# Patient Record
Sex: Male | Born: 1950 | Race: White | Hispanic: No | Smoking: Former smoker
Health system: Southern US, Community
[De-identification: ages and names within clinical notes are randomized; demographics above are authoritative.]

## PROBLEM LIST (undated history)

## (undated) DIAGNOSIS — H509 Unspecified strabismus: Secondary | ICD-10-CM

## (undated) DIAGNOSIS — E785 Hyperlipidemia, unspecified: Secondary | ICD-10-CM

## (undated) DIAGNOSIS — K4090 Unilateral inguinal hernia, without obstruction or gangrene, not specified as recurrent: Secondary | ICD-10-CM

## (undated) DIAGNOSIS — R011 Cardiac murmur, unspecified: Secondary | ICD-10-CM

## (undated) DIAGNOSIS — H9190 Unspecified hearing loss, unspecified ear: Secondary | ICD-10-CM

## (undated) DIAGNOSIS — Z8719 Personal history of other diseases of the digestive system: Secondary | ICD-10-CM

## (undated) DIAGNOSIS — M549 Dorsalgia, unspecified: Secondary | ICD-10-CM

## (undated) DIAGNOSIS — I10 Essential (primary) hypertension: Secondary | ICD-10-CM

## (undated) DIAGNOSIS — K219 Gastro-esophageal reflux disease without esophagitis: Secondary | ICD-10-CM

## (undated) DIAGNOSIS — E039 Hypothyroidism, unspecified: Secondary | ICD-10-CM

## (undated) DIAGNOSIS — R7303 Prediabetes: Secondary | ICD-10-CM

## (undated) DIAGNOSIS — E079 Disorder of thyroid, unspecified: Secondary | ICD-10-CM

## (undated) DIAGNOSIS — M109 Gout, unspecified: Secondary | ICD-10-CM

## (undated) DIAGNOSIS — G4700 Insomnia, unspecified: Secondary | ICD-10-CM

## (undated) HISTORY — DX: Gastro-esophageal reflux disease without esophagitis: K21.9

## (undated) HISTORY — DX: Disorder of thyroid, unspecified: E07.9

## (undated) HISTORY — DX: Personal history of other diseases of the digestive system: Z87.19

## (undated) HISTORY — DX: Hyperlipidemia, unspecified: E78.5

## (undated) HISTORY — PX: TONSILLECTOMY: SUR1361

## (undated) HISTORY — DX: Cardiac murmur, unspecified: R01.1

## (undated) HISTORY — DX: Insomnia, unspecified: G47.00

## (undated) HISTORY — DX: Dorsalgia, unspecified: M54.9

## (undated) HISTORY — DX: Unspecified hearing loss, unspecified ear: H91.90

## (undated) HISTORY — DX: Essential (primary) hypertension: I10

## (undated) HISTORY — DX: Unspecified strabismus: H50.9

## (undated) HISTORY — DX: Gout, unspecified: M10.9

## (undated) HISTORY — PX: HERNIA REPAIR: SHX51

---
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
## 2005-01-27 ENCOUNTER — Ambulatory Visit (HOSPITAL_COMMUNITY): Admission: RE | Admit: 2005-01-27 | Discharge: 2005-01-27 | Payer: Self-pay | Admitting: Gastroenterology

## 2008-12-29 DEATH — deceased

## 2011-11-29 HISTORY — PX: COLONOSCOPY W/ BIOPSIES AND POLYPECTOMY: SHX1376

## 2013-02-18 ENCOUNTER — Other Ambulatory Visit: Payer: Self-pay | Admitting: Gastroenterology

## 2013-11-11 ENCOUNTER — Encounter (INDEPENDENT_AMBULATORY_CARE_PROVIDER_SITE_OTHER): Payer: Self-pay | Admitting: Surgery

## 2013-11-26 ENCOUNTER — Encounter (INDEPENDENT_AMBULATORY_CARE_PROVIDER_SITE_OTHER): Payer: Self-pay

## 2013-11-26 ENCOUNTER — Encounter (INDEPENDENT_AMBULATORY_CARE_PROVIDER_SITE_OTHER): Payer: Self-pay | Admitting: Surgery

## 2013-11-26 ENCOUNTER — Ambulatory Visit (INDEPENDENT_AMBULATORY_CARE_PROVIDER_SITE_OTHER): Payer: No Typology Code available for payment source | Admitting: Surgery

## 2013-11-26 VITALS — BP 180/96 | HR 80 | Temp 98.4°F | Resp 15 | Ht 65.25 in | Wt 226.2 lb

## 2013-11-26 DIAGNOSIS — K4090 Unilateral inguinal hernia, without obstruction or gangrene, not specified as recurrent: Secondary | ICD-10-CM

## 2013-11-26 NOTE — Addendum Note (Signed)
Addended by: Abigail Miyamoto A on: 11/26/2013 11:37 AM   Modules accepted: Orders, Level of Service

## 2013-11-26 NOTE — Progress Notes (Deleted)
Subjective:     Patient ID: Brandon Obrien, male   DOB: 1950-12-19, 62 y.o.   MRN: [id number]  HPI She is here for another wound check. She is doing well and has been off antibiotics for a week  Review of Systems     Objective:   Physical Exam The wound is and was completely healed. There is no evidence of infection.    Assessment:     Healed abdominal wound     Plan:     She will continue her current care and I will see her back as needed

## 2013-11-26 NOTE — Progress Notes (Signed)
Patient ID: Brandon Obrien, male   DOB: [birth year], 62 y.o.   MRN: [id number]  Chief Complaint  Patient presents with  . New Evaluation    eval RIH    HPI Brandon Obrien is a 62 y.o. male.   HPI This is a pleasant gentleman who was referred by Dr. Johny Blamer for evaluation of a large right inguinal hernia. He has had a hernia for many years but reports now that it will not reduce. He has no pain for obstructive symptoms. He is otherwise without complaints. Past Medical History  Diagnosis Date  . Hyperlipidemia   . Hypertension   . Gout   . GERD (gastroesophageal reflux disease)   . Back pain   . Hearing loss   . Insomnia   . H/O hemorrhoids   . Strabismus   . Heart murmur   . Thyroid disease     History reviewed. No pertinent past surgical history.  Family History  Problem Relation Age of Onset  . Cancer Mother     lung  . Cancer Father     protstate  . Cancer Maternal Grandmother     colon    Social History History  Substance Use Topics  . Smoking status: Former Smoker    Quit date: 11/28/1972  . Smokeless tobacco: Never Used     Comment: quit in 1974  . Alcohol Use: 1.2 oz/week    2 Cans of beer per week    Allergies  Allergen Reactions  . Valturna [Aliskiren-Valsartan]     Current Outpatient Prescriptions  Medication Sig Dispense Refill  . acetaminophen (TYLENOL) 325 MG tablet Take 650 mg by mouth every 6 (six) hours as needed.      Marland Kitchen allopurinol (ZYLOPRIM) 300 MG tablet Take 300 mg by mouth daily.      Marland Kitchen amLODipine (NORVASC) 5 MG tablet Take 5 mg by mouth daily.      Marland Kitchen aspirin 81 MG tablet Take 81 mg by mouth daily.      Marland Kitchen atorvastatin (LIPITOR) 40 MG tablet Take 40 mg by mouth daily.      . calcium carbonate (OS-CAL) 600 MG TABS tablet Take 600 mg by mouth 2 (two) times daily with a meal.      . irbesartan-hydrochlorothiazide (AVALIDE) 300-12.5 MG per tablet Take 1 tablet by mouth daily.      Marland Kitchen levothyroxine (SYNTHROID, LEVOTHROID) 100 MCG tablet Take  100 mcg by mouth daily before breakfast.      . loratadine (CLARITIN) 10 MG tablet Take 10 mg by mouth daily.      . metoprolol succinate (TOPROL-XL) 100 MG 24 hr tablet Take 100 mg by mouth daily. Take with or immediately following a meal.      . Omega-3 Fatty Acids (FISH OIL) 1000 MG CAPS Take by mouth.      . vitamin C (ASCORBIC ACID) 500 MG tablet Take 500 mg by mouth daily.      Marland Kitchen zolpidem (AMBIEN) 5 MG tablet Take 5 mg by mouth at bedtime as needed for sleep.       No current facility-administered medications for this visit.    Review of Systems Review of Systems  Constitutional: Negative for fever, chills and unexpected weight change.  HENT: Positive for hearing loss. Negative for congestion, sore throat, trouble swallowing and voice change.   Eyes: Positive for visual disturbance.  Respiratory: Negative for cough and wheezing.   Cardiovascular: Negative for chest pain, palpitations and leg swelling.  Gastrointestinal:  Negative for nausea, vomiting, abdominal pain, diarrhea, constipation, blood in stool, abdominal distention, anal bleeding and rectal pain.  Genitourinary: Negative for hematuria and difficulty urinating.  Musculoskeletal: Negative for arthralgias.  Skin: Negative for rash and wound.  Neurological: Negative for seizures, syncope, weakness and headaches.  Hematological: Negative for adenopathy. Does not bruise/bleed easily.  Psychiatric/Behavioral: Negative for confusion.    Blood pressure 180/96, pulse 80, temperature 98.4 F (36.9 C), temperature source Temporal, resp. rate 15, height 5' 5.25" (1.657 m), weight 226 lb 3.2 oz (102.604 kg).  Physical Exam Physical Exam  Constitutional: He is oriented to person, place, and time. He appears well-developed and well-nourished. No distress.  HENT:  Head: Normocephalic and atraumatic.  Right Ear: External ear normal.  Left Ear: External ear normal.  Nose: Nose normal.  Mouth/Throat: Oropharynx is clear and moist.   Eyes: Conjunctivae are normal. Pupils are equal, round, and reactive to light. Right eye exhibits no discharge. Left eye exhibits no discharge. No scleral icterus.  Neck: Normal range of motion. Neck supple. No tracheal deviation present.  Cardiovascular: Normal rate, regular rhythm, normal heart sounds and intact distal pulses.   No murmur heard. Pulmonary/Chest: Effort normal and breath sounds normal. No respiratory distress. He has no wheezes.  Abdominal: Soft. Bowel sounds are normal. There is no tenderness. There is no rebound and no guarding.  Very large, chronically incarcerated right inguinal hernia which is nontender and going down into the scrotum  Musculoskeletal: Normal range of motion. He exhibits no edema and no tenderness.  Lymphadenopathy:    He has no cervical adenopathy.  Neurological: He is alert and oriented to person, place, and time.  Skin: Skin is warm and dry. No rash noted. He is not diaphoretic. No erythema.  Psychiatric: His behavior is normal. Judgment normal.    Data Reviewed   Assessment    Chronically incarcerated right inguinal hernia     Plan Open right inguinal hernia repair with mesh was recommended. I discussed this with him in detail. I discussed the risk of surgery which includes but is not limited to bleeding, infection, injury to surrounding structures, chronic pain, nerve entrapment, recurrence, et Karie Soda. He understands and wishes to proceed. Surgery will be scheduled       Everett Ricciardelli A 11/26/2013, 11:31 AM

## 2013-11-27 ENCOUNTER — Encounter (HOSPITAL_COMMUNITY): Payer: Self-pay | Admitting: Pharmacy Technician

## 2013-12-04 ENCOUNTER — Encounter (HOSPITAL_COMMUNITY): Payer: 59 | Admitting: Certified Registered Nurse Anesthetist

## 2013-12-04 ENCOUNTER — Ambulatory Visit (HOSPITAL_COMMUNITY): Payer: 59 | Admitting: Certified Registered Nurse Anesthetist

## 2013-12-04 ENCOUNTER — Encounter (HOSPITAL_COMMUNITY): Payer: Self-pay

## 2013-12-04 ENCOUNTER — Encounter (HOSPITAL_COMMUNITY)
Admission: RE | Admit: 2013-12-04 | Discharge: 2013-12-04 | Disposition: A | Discharge status: Home / Self Care | Payer: 59 | Source: Ambulatory Visit | Attending: Surgery | Admitting: Surgery

## 2013-12-04 ENCOUNTER — Ambulatory Visit (HOSPITAL_COMMUNITY)
Admission: RE | Admit: 2013-12-04 | Discharge: 2013-12-04 | Disposition: A | Discharge status: Home / Self Care | Payer: 59 | Source: Ambulatory Visit | Attending: Surgery | Admitting: Surgery

## 2013-12-04 DIAGNOSIS — Z01810 Encounter for preprocedural cardiovascular examination: Secondary | ICD-10-CM | POA: Insufficient documentation

## 2013-12-04 DIAGNOSIS — I10 Essential (primary) hypertension: Secondary | ICD-10-CM | POA: Insufficient documentation

## 2013-12-04 DIAGNOSIS — Z87891 Personal history of nicotine dependence: Secondary | ICD-10-CM | POA: Insufficient documentation

## 2013-12-04 DIAGNOSIS — K4090 Unilateral inguinal hernia, without obstruction or gangrene, not specified as recurrent: Secondary | ICD-10-CM | POA: Insufficient documentation

## 2013-12-04 DIAGNOSIS — Z01818 Encounter for other preprocedural examination: Secondary | ICD-10-CM | POA: Insufficient documentation

## 2013-12-04 DIAGNOSIS — Z01812 Encounter for preprocedural laboratory examination: Secondary | ICD-10-CM | POA: Insufficient documentation

## 2013-12-04 HISTORY — DX: Prediabetes: R73.03

## 2013-12-04 HISTORY — DX: Unilateral inguinal hernia, without obstruction or gangrene, not specified as recurrent: K40.90

## 2013-12-04 LAB — BASIC METABOLIC PANEL
BUN: 16 mg/dL (ref 6–23)
CO2: 26 meq/L (ref 19–32)
CREATININE: 0.9 mg/dL (ref 0.50–1.35)
Calcium: 9.4 mg/dL (ref 8.4–10.5)
Chloride: 96 mEq/L (ref 96–112)
GFR calc Af Amer: >90 mL/min (ref >90)
Glucose, Bld: 118 mg/dL — ABNORMAL HIGH (ref 70–99)
Potassium: 4.5 mEq/L (ref 3.7–5.3)
Sodium: 135 mEq/L — ABNORMAL LOW (ref 137–147)

## 2013-12-04 LAB — CBC
HEMATOCRIT: 41.2 % (ref 39.0–52.0)
HEMOGLOBIN: 14.5 g/dL (ref 13.0–17.0)
MCH: 32.7 pg (ref 26.0–34.0)
MCHC: 35.2 g/dL (ref 30.0–36.0)
MCV: 92.8 fL (ref 78.0–100.0)
Platelets: 187 10*3/uL (ref 150–400)
RBC: 4.44 MIL/uL (ref 4.22–5.81)
RDW: 12.1 % (ref 11.5–15.5)
WBC: 4.8 10*3/uL (ref 4.0–10.5)

## 2013-12-04 NOTE — Patient Instructions (Signed)
Brandon Obrien  12/04/2013                           YOUR PROCEDURE IS SCHEDULED ON: 12/05/13               PLEASE REPORT TO SHORT STAY CENTER AT : 5:30 am               CALL THIS NUMBER IF ANY PROBLEMS THE DAY OF SURGERY :               [phone]                      REMEMBER:   Do not eat food or drink liquids AFTER MIDNIGHT   Take these medicines the morning of surgery with A SIP OF WATER: ALLOPURINOL / AMLODIPINE / ATORAVASTIN / LEVOTHYROXINE / METOPROLOL / CLARITIN   Do not wear jewelry, make-up   Do not wear lotions, powders, or perfumes.   Do not shave legs or underarms 12 hrs. before surgery (men may shave face)  Do not bring valuables to the hospital.  Contacts, dentures or bridgework may not be worn into surgery.  Leave suitcase in the car. After surgery it may be brought to your room.  For patients admitted to the hospital more than one night, checkout time is 11:00                          The day of discharge.   Patients discharged the day of surgery will not be allowed to drive home                             If going home same day of surgery, must have someone stay with you first                           24 hrs at home and arrange for some one to drive you home from hospital.    Special Instructions:   Please read over the following fact sheets that you were given:               1. NO ASPIRIN OR SUPPLIMENTS IN AM                               2. [place] PREPARING FOR SURGERY SHEET                                                X_____________________________________________________________________        Failure to follow these instructions may result in cancellation of your surgery

## 2013-12-04 NOTE — Progress Notes (Signed)
12/04/13 0825  OBSTRUCTIVE SLEEP APNEA  Have you ever been diagnosed with sleep apnea through a sleep study? No  Do you snore loudly (loud enough to be heard through closed doors)?  1  Do you often feel tired, fatigued, or sleepy during the daytime? 0  Has anyone observed you stop breathing during your sleep? 1  Do you have, or are you being treated for high blood pressure? 1  BMI more than 35 kg/m2? 1  Age over 62 years old? 1  Neck circumference greater than 40 cm/18 inches? 0  Gender: 1  Obstructive Sleep Apnea Score 6  Score 4 or greater  Results sent to PCP

## 2013-12-04 NOTE — H&P (Signed)
Chief Complaint   Patient presents with   .  New Evaluation     eval RIH   HPI  Brandon Obrien is a 63 y.o. male.  HPI  This is a pleasant gentleman who was referred by Dr. Johny BlamerWilliam Harris for evaluation of a large right inguinal hernia. He has had a hernia for many years but reports now that it will not reduce. He has no pain for obstructive symptoms. He is otherwise without complaints.  Past Medical History   Diagnosis  Date   .  Hyperlipidemia    .  Hypertension    .  Gout    .  GERD (gastroesophageal reflux disease)    .  Back pain    .  Hearing loss    .  Insomnia    .  H/O hemorrhoids    .  Strabismus    .  Heart murmur    .  Thyroid disease    History reviewed. No pertinent past surgical history.  Family History   Problem  Relation  Age of Onset   .  Cancer  Mother      lung   .  Cancer  Father      protstate   .  Cancer  Maternal Grandmother      colon   Social History  History   Substance Use Topics   .  Smoking status:  Former Smoker     Quit date:  11/28/1972   .  Smokeless tobacco:  Never Used      Comment: quit in 1974   .  Alcohol Use:  1.2 oz/week     2 Cans of beer per week    Allergies   Allergen  Reactions   .  Valturna [Aliskiren-Valsartan]     Current Outpatient Prescriptions   Medication  Sig  Dispense  Refill   .  acetaminophen (TYLENOL) 325 MG tablet  Take 650 mg by mouth every 6 (six) hours as needed.     Marland Kitchen.  allopurinol (ZYLOPRIM) 300 MG tablet  Take 300 mg by mouth daily.     Marland Kitchen.  amLODipine (NORVASC) 5 MG tablet  Take 5 mg by mouth daily.     Marland Kitchen.  aspirin 81 MG tablet  Take 81 mg by mouth daily.     Marland Kitchen.  atorvastatin (LIPITOR) 40 MG tablet  Take 40 mg by mouth daily.     .  calcium carbonate (OS-CAL) 600 MG TABS tablet  Take 600 mg by mouth 2 (two) times daily with a meal.     .  irbesartan-hydrochlorothiazide (AVALIDE) 300-12.5 MG per tablet  Take 1 tablet by mouth daily.     Marland Kitchen.  levothyroxine (SYNTHROID, LEVOTHROID) 100 MCG tablet  Take 100 mcg  by mouth daily before breakfast.     .  loratadine (CLARITIN) 10 MG tablet  Take 10 mg by mouth daily.     .  metoprolol succinate (TOPROL-XL) 100 MG 24 hr tablet  Take 100 mg by mouth daily. Take with or immediately following a meal.     .  Omega-3 Fatty Acids (FISH OIL) 1000 MG CAPS  Take by mouth.     .  vitamin C (ASCORBIC ACID) 500 MG tablet  Take 500 mg by mouth daily.     Marland Kitchen.  zolpidem (AMBIEN) 5 MG tablet  Take 5 mg by mouth at bedtime as needed for sleep.      No current facility-administered medications for this visit.  Review of Systems  Review of Systems  Constitutional: Negative for fever, chills and unexpected weight change.  HENT: Positive for hearing loss. Negative for congestion, sore throat, trouble swallowing and voice change.  Eyes: Positive for visual disturbance.  Respiratory: Negative for cough and wheezing.  Cardiovascular: Negative for chest pain, palpitations and leg swelling.  Gastrointestinal: Negative for nausea, vomiting, abdominal pain, diarrhea, constipation, blood in stool, abdominal distention, anal bleeding and rectal pain.  Genitourinary: Negative for hematuria and difficulty urinating.  Musculoskeletal: Negative for arthralgias.  Skin: Negative for rash and wound.  Neurological: Negative for seizures, syncope, weakness and headaches.  Hematological: Negative for adenopathy. Does not bruise/bleed easily.  Psychiatric/Behavioral: Negative for confusion.  Blood pressure 180/96, pulse 80, temperature 98.4 F (36.9 C), temperature source Temporal, resp. rate 15, height 5' 5.25" (1.657 m), weight 226 lb 3.2 oz (102.604 kg).  Physical Exam  Physical Exam  Constitutional: He is oriented to person, place, and time. He appears well-developed and well-nourished. No distress.  HENT:  Head: Normocephalic and atraumatic.  Right Ear: External ear normal.  Left Ear: External ear normal.  Nose: Nose normal.  Mouth/Throat: Oropharynx is clear and moist.  Eyes:  Conjunctivae are normal. Pupils are equal, round, and reactive to light. Right eye exhibits no discharge. Left eye exhibits no discharge. No scleral icterus.  Neck: Normal range of motion. Neck supple. No tracheal deviation present.  Cardiovascular: Normal rate, regular rhythm, normal heart sounds and intact distal pulses.  No murmur heard.  Pulmonary/Chest: Effort normal and breath sounds normal. No respiratory distress. He has no wheezes.  Abdominal: Soft. Bowel sounds are normal. There is no tenderness. There is no rebound and no guarding.  Very large, chronically incarcerated right inguinal hernia which is nontender and going down into the scrotum  Musculoskeletal: Normal range of motion. He exhibits no edema and no tenderness.  Lymphadenopathy:  He has no cervical adenopathy.  Neurological: He is alert and oriented to person, place, and time.  Skin: Skin is warm and dry. No rash noted. He is not diaphoretic. No erythema.  Psychiatric: His behavior is normal. Judgment normal.  Data Reviewed  Assessment  Chronically incarcerated right inguinal hernia  Plan  Open right inguinal hernia repair with mesh was recommended. I discussed this with him in detail. I discussed the risk of surgery which includes but is not limited to bleeding, infection, injury to surrounding structures, chronic pain, nerve entrapment, recurrence, et Karie Sodacetera. He understands and wishes to proceed. Surgery will be scheduled

## 2013-12-05 ENCOUNTER — Telehealth (INDEPENDENT_AMBULATORY_CARE_PROVIDER_SITE_OTHER): Payer: Self-pay | Admitting: General Surgery

## 2013-12-05 ENCOUNTER — Ambulatory Visit (HOSPITAL_COMMUNITY)
Admission: RE | Admit: 2013-12-05 | Discharge: 2013-12-05 | Disposition: A | Discharge status: Home / Self Care | Payer: 59 | Source: Ambulatory Visit | Attending: Surgery | Admitting: Surgery

## 2013-12-05 ENCOUNTER — Encounter (HOSPITAL_COMMUNITY)
Admission: RE | Disposition: A | Discharge status: Home / Self Care | Payer: Self-pay | Source: Ambulatory Visit | Attending: Surgery

## 2013-12-05 ENCOUNTER — Other Ambulatory Visit (INDEPENDENT_AMBULATORY_CARE_PROVIDER_SITE_OTHER): Payer: Self-pay | Admitting: Surgery

## 2013-12-05 ENCOUNTER — Encounter (HOSPITAL_COMMUNITY): Payer: Self-pay | Admitting: *Deleted

## 2013-12-05 DIAGNOSIS — K4030 Unilateral inguinal hernia, with obstruction, without gangrene, not specified as recurrent: Secondary | ICD-10-CM | POA: Insufficient documentation

## 2013-12-05 DIAGNOSIS — K4090 Unilateral inguinal hernia, without obstruction or gangrene, not specified as recurrent: Secondary | ICD-10-CM

## 2013-12-05 DIAGNOSIS — Z538 Procedure and treatment not carried out for other reasons: Secondary | ICD-10-CM | POA: Insufficient documentation

## 2013-12-05 SURGERY — Surgical Case
Anesthesia: *Unknown

## 2013-12-05 SURGERY — REPAIR, HERNIA, INGUINAL, ADULT
Anesthesia: General

## 2013-12-05 MED ORDER — MIDAZOLAM HCL 2 MG/2ML IJ SOLN
INTRAMUSCULAR | Status: AC
Start: 2013-12-05 — End: 2013-12-05
  Filled 2013-12-05: qty 2

## 2013-12-05 MED ORDER — FENTANYL CITRATE 0.05 MG/ML IJ SOLN
INTRAMUSCULAR | Status: AC
  Filled 2013-12-05: qty 2

## 2013-12-05 MED ORDER — DEXAMETHASONE SODIUM PHOSPHATE 10 MG/ML IJ SOLN
INTRAMUSCULAR | Status: AC
  Filled 2013-12-05: qty 1

## 2013-12-05 MED ORDER — CEFAZOLIN SODIUM-DEXTROSE 2-3 GM-% IV SOLR
INTRAVENOUS | Status: AC
Start: 2013-12-05 — End: 2013-12-05
  Filled 2013-12-05: qty 50

## 2013-12-05 MED ORDER — ONDANSETRON HCL 4 MG/2ML IJ SOLN
INTRAMUSCULAR | Status: AC
  Filled 2013-12-05: qty 2

## 2013-12-05 MED ORDER — CEFAZOLIN SODIUM-DEXTROSE 2-3 GM-% IV SOLR: 2.0000 g | INTRAVENOUS | Status: DC

## 2013-12-05 MED ORDER — PROPOFOL 10 MG/ML IV BOLUS
INTRAVENOUS | Status: AC
  Filled 2013-12-05: qty 20

## 2013-12-05 MED ORDER — LACTATED RINGERS IV SOLN
INTRAVENOUS | Status: DC | PRN
  Administered 2013-12-05: 07:00:00 via INTRAVENOUS

## 2013-12-05 MED ORDER — ROCURONIUM BROMIDE 100 MG/10ML IV SOLN
INTRAVENOUS | Status: AC
  Filled 2013-12-05: qty 1

## 2013-12-05 NOTE — Preoperative (Signed)
Beta Blockers   Reason not to administer Beta Blockers:Not Applicable 

## 2013-12-05 NOTE — Anesthesia Preprocedure Evaluation (Addendum)
Anesthesia Evaluation    Airway       Dental   Pulmonary former smoker,          Cardiovascular hypertension,     Neuro/Psych    GI/Hepatic   Endo/Other    Renal/GU      Musculoskeletal   Abdominal   Peds  Hematology   Anesthesia Other Findings   Reproductive/Obstetrics                          Anesthesia Physical Anesthesia Plan  ASA: III  Anesthesia Plan:    Post-op Pain Management:    Induction:   Airway Management Planned:   Additional Equipment:   Intra-op Plan:   Post-operative Plan:   Informed Consent:   Plan Discussed with:   Anesthesia Plan Comments: (Case cancelled.  Abnormal EKG. Recent history of dizzy spell. Family history of IHSS in 3 family members. + Murmur. Consider stress test and echo.)       Anesthesia Quick Evaluation

## 2013-12-05 NOTE — Interval H&P Note (Signed)
History and Physical Interval Note: Apparently the patient"s EKG showed significant changes when interpreted late last night.  He has had weakness and dizzy spells.  Anesthesia requests surgery be cancelled and that the patient undergoes Obrien cardiac workup.  I agree with anesthesia.  Will arrange for outpatient cardiac work up  12/05/2013 7:10 AM  [name]  has presented today for surgery, with the diagnosis of right inguinal hernia   The various methods of treatment have been discussed with the patient and family. After consideration of risks, benefits and other options for treatment, the patient has consented to  Procedure(s): HERNIA REPAIR INGUINAL ADULT (Right) INSERTION OF MESH (Right) as Obrien surgical intervention .  The patient's history has been reviewed, patient examined, no change in status, stable for surgery.  I have reviewed the patient's chart and labs.  Questions were answered to the patient's satisfaction.     Brandon Obrien

## 2013-12-05 NOTE — Telephone Encounter (Signed)
Spoke with the patient and informed him that I got him scheduled to see Dr. Rennis GoldenHilty for cardiac clearance on 12/17/13 with an arrival time of 9:30.  Address was given to the patient and it was also explained that they will mail him a packet that he will need to fill out and take with him to this appt as well as updated insurance cards, a medication list, and his copay.  Patient understood and was agreeable.

## 2013-12-17 ENCOUNTER — Ambulatory Visit (INDEPENDENT_AMBULATORY_CARE_PROVIDER_SITE_OTHER): Payer: 59 | Admitting: Internal Medicine

## 2013-12-17 ENCOUNTER — Encounter: Payer: Self-pay | Admitting: Internal Medicine

## 2013-12-17 VITALS — BP 130/88 | HR 72 | Ht 65.5 in | Wt 223.3 lb

## 2013-12-17 DIAGNOSIS — Z8249 Family history of ischemic heart disease and other diseases of the circulatory system: Secondary | ICD-10-CM

## 2013-12-17 DIAGNOSIS — Z01810 Encounter for preprocedural cardiovascular examination: Secondary | ICD-10-CM | POA: Insufficient documentation

## 2013-12-17 DIAGNOSIS — I10 Essential (primary) hypertension: Secondary | ICD-10-CM

## 2013-12-17 DIAGNOSIS — E785 Hyperlipidemia, unspecified: Secondary | ICD-10-CM | POA: Insufficient documentation

## 2013-12-17 DIAGNOSIS — R9431 Abnormal electrocardiogram [ECG] [EKG]: Secondary | ICD-10-CM

## 2013-12-17 DIAGNOSIS — R011 Cardiac murmur, unspecified: Secondary | ICD-10-CM

## 2013-12-17 NOTE — Progress Notes (Signed)
OFFICE NOTE  Chief Complaint:  Pre-op assessment, murmur, abnormal EKG  Primary Care Physician: Johny Blamer, MD  HPI:  Brandon Obrien is a pleasant 63 year old male who works in the [employer]. 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 He's had difficulty with what sounds like sleep apnea, but is very adamant about not getting a test or perhaps wearing CPAP if he needs it.  He also has hypertension, dyslipidemia and an abnormal EKG. He's been struggling with a inguinal hernia and was scheduled for repair by Dr. Magnus Ivan. His preoperative EKG was abnormal and he was referred for further workup. He is also known to have a heart murmur. His family history is significant for a father and 2 brothers who have hypertrophic obstructive cardiomyopathy.  He apparently underwent an echocardiogram for screening in the 1970s and was told he did not have it at that time. Subsequently, he has developed a heart murmur. He did not describe any chest pain with exertion, but is not physically active and is moderately obese.  He does get short of breath with marked exertion.  PMHx:  Past Medical History  Diagnosis Date  . Hyperlipidemia   . Hypertension   . Gout   . GERD (gastroesophageal reflux disease)   . Back pain   . Hearing loss   . Insomnia   . H/O hemorrhoids   . Strabismus   . Heart murmur   . Thyroid disease   . Inguinal hernia     right  . Prediabetes     Past Surgical History  Procedure Laterality Date  . Tonsillectomy    . Fractured skull  1960    FAMHx:  Family History  Problem Relation Age of Onset  . Lung cancer Mother   . Prostate cancer Father     IHSS. COPD, emphysema  . Colon cancer Maternal Grandmother   . Stroke Maternal Grandfather   . Emphysema Maternal Grandfather   . Kidney failure Paternal Grandmother   . Stroke Paternal Grandfather   . Diabetes Brother     agent orange exposure  . Diabetes  Sister   . Bipolar disorder Child   . Bipolar disorder Child     SOCHx:   reports that he quit smoking about 41 years ago. He has never used smokeless tobacco. He reports that he drinks about 15.0 ounces of alcohol per week. He reports that he does not use illicit drugs.  ALLERGIES:  Allergies  Allergen Reactions  . Valturna [Aliskiren-Valsartan] Other (See Comments)    Blurred vision, dizziness     ROS: A comprehensive review of systems was negative except for: Respiratory: positive for dyspnea on exertion Gastrointestinal: positive for hernia  HOME MEDS: Current Outpatient Prescriptions  Medication Sig Dispense Refill  . acetaminophen (TYLENOL) 325 MG tablet Take 650 mg by mouth every 6 (six) hours as needed for mild pain or moderate pain.       Marland Kitchen allopurinol (ZYLOPRIM) 300 MG tablet Take 300 mg by mouth daily.      Marland Kitchen amLODipine (NORVASC) 5 MG tablet Take 5 mg by mouth every morning.       Marland Kitchen aspirin 81 MG tablet Take 81 mg by mouth every morning.       Marland Kitchen atorvastatin (LIPITOR) 40 MG tablet Take 40 mg by mouth every morning.       . irbesartan-hydrochlorothiazide (AVALIDE) 300-12.5 MG per tablet Take 1 tablet by mouth every morning.       Marland Kitchen  levothyroxine (SYNTHROID, LEVOTHROID) 100 MCG tablet Take 100 mcg by mouth daily before breakfast.      . metoprolol succinate (TOPROL-XL) 100 MG 24 hr tablet Take 50 mg by mouth every morning. Take with or immediately following a meal.      . Omega-3 Fatty Acids (FISH OIL) 1000 MG CAPS Take 1,000 mg by mouth daily.       . vitamin C (ASCORBIC ACID) 500 MG tablet Take 500 mg by mouth daily.      Marland Kitchen. zolpidem (AMBIEN) 5 MG tablet Take 5 mg by mouth at bedtime as needed for sleep.      Marland Kitchen. loratadine (CLARITIN) 10 MG tablet Take 10 mg by mouth daily.       No current facility-administered medications for this visit.    LABS/IMAGING: No results found for this or any previous visit (from the past 48 hour(s)). No results found.  VITALS: BP  130/88  Pulse 72  Ht 5' 5.5" (1.664 m)  Wt 223 lb 4.8 oz (101.288 kg)  BMI 36.58 kg/m2  EXAM: General appearance: alert and no distress Neck: no carotid bruit and no JVD Lungs: clear to auscultation bilaterally Heart: regular rate and rhythm, S1, S2 normal and systolic murmur: early systolic 2/6, crescendo at 2nd right intercostal space Abdomen: soft, non-tender; bowel sounds normal; no masses,  no organomegaly and obese Extremities: edema trace pedal edema Pulses: 2+ and symmetric Skin: Skin color, texture, turgor normal. No rashes or lesions Neurologic: Grossly normal, except right eye exotropia Psych: Pleasant  EKG: Normal sinus rhythm at 72, nonspecific intraventricular conduction delay QRS duration 128 ms, voltage criteria for LVH  ASSESSMENT: 1. Indeterminate risk for an intermediate risk surgery 2. Abnormal EKG suggesting possible cardiac hypertrophy and interventricular conduction delay 3. Strong family history of first degree relatives with HOCM 4. Hypertension 5. Dyslipidemia 6. Moderate obesity with little exercise 7. Mild dyspnea with exertion 8. Preoperative risk assessment  PLAN: 1.   [name] presents today for preoperative cardiovascular risk assessment which is a good idea in my opinion. He was scheduled to undergo a hernia surgery under general anesthesia and was found to have an abnormal EKG. There is a strong family history of HOCM.  His EKG is abnormal suggesting possibly a hypertrophic process and he does have a systolic murmur however it is soft and not typical of significant obstruction.  I would recommend an echocardiogram to further evaluate these changes along with some of his mild shortness of breath. In addition I would recommend an exercise nuclear stress test based on his risk factors to risk stratify him prior to surgery.  Plan to see him back to discuss results of these studies in a few weeks.  Thank you again for the kind referral.  Chrystie NoseKenneth C.  Eliza Green, MD, [facility] Attending Cardiologist [facility]  Elizabelle Fite C 12/17/2013, 10:59 AM

## 2013-12-17 NOTE — Patient Instructions (Signed)
Your physician has requested that you have en exercise stress myoview. For further information please visit [email or web address]. Please follow instruction sheet, as given.  Your physician has requested that you have an echocardiogram. Echocardiography is a painless test that uses sound waves to create images of your heart. It provides your doctor with information about the size and shape of your heart and how well your heart's chambers and valves are working. This procedure takes approximately one hour. There are no restrictions for this procedure.  .Your physician recommends that you schedule a follow-up appointment in: a few weeks, after your tests.

## 2013-12-23 ENCOUNTER — Encounter (INDEPENDENT_AMBULATORY_CARE_PROVIDER_SITE_OTHER): Payer: No Typology Code available for payment source | Admitting: Surgery

## 2013-12-24 ENCOUNTER — Ambulatory Visit (HOSPITAL_COMMUNITY)
Admission: RE | Admit: 2013-12-24 | Discharge: 2013-12-24 | Disposition: A | Discharge status: Home / Self Care | Payer: 59 | Source: Ambulatory Visit | Attending: Cardiology | Admitting: Cardiology

## 2013-12-24 DIAGNOSIS — E785 Hyperlipidemia, unspecified: Secondary | ICD-10-CM

## 2013-12-24 DIAGNOSIS — R9431 Abnormal electrocardiogram [ECG] [EKG]: Secondary | ICD-10-CM

## 2013-12-24 DIAGNOSIS — I10 Essential (primary) hypertension: Secondary | ICD-10-CM

## 2013-12-24 DIAGNOSIS — Z01810 Encounter for preprocedural cardiovascular examination: Secondary | ICD-10-CM

## 2013-12-24 DIAGNOSIS — Z01818 Encounter for other preprocedural examination: Secondary | ICD-10-CM | POA: Insufficient documentation

## 2013-12-24 DIAGNOSIS — Z8249 Family history of ischemic heart disease and other diseases of the circulatory system: Secondary | ICD-10-CM

## 2013-12-24 MED ORDER — TECHNETIUM TC 99M SESTAMIBI GENERIC - CARDIOLITE
30.0000 | Freq: Once | INTRAVENOUS | Status: AC | PRN
  Administered 2013-12-24: 30 via INTRAVENOUS

## 2013-12-24 MED ORDER — TECHNETIUM TC 99M SESTAMIBI GENERIC - CARDIOLITE
10.0000 | Freq: Once | INTRAVENOUS | Status: AC | PRN
  Administered 2013-12-24: 10 via INTRAVENOUS

## 2013-12-24 NOTE — Procedures (Addendum)
[place] [facility] [address] [place] [address] [phone]  Cardiology Nuclear Med Study  Brandon Obrien is a 63 y.o. male     MRN : [id number]     DOB: 02/06/1951  Procedure Date: 12/24/2013  Nuclear Med Background Indication for Stress Test:  Evaluation for Ischemia, Surgical Clearance and Abnormal EKG History:  murmur Cardiac Risk Factors: Family History - CAD, History of Smoking, Hypertension, Lipids and Overweight  Symptoms:  DOE   Nuclear Pre-Procedure Caffeine/Decaff Intake:  7:00pm NPO After: 5:00am   IV Site: R Forearm  IV 0.9% NS with Angio Cath:  22g  Chest Size (in):  42"  IV Started by: Emmit PomfretAmanda Hicks, RN  Height: 5\' 5"  (1.651 m)  Cup Size: n/a  BMI:  Body mass index is 37.11 kg/(m^2). Weight:  223 lb (101.152 kg)   Tech Comments:  n/a    Nuclear Med Study 1 or 2 day study: 1 day  Stress Test Type:  Stress  Order Authorizing Provider:  Zoila ShutterKenneth Hilty, MD   Resting Radionuclide: Technetium 3286m Sestamibi  Resting Radionuclide Dose: 10.5 mCi   Stress Radionuclide:  Technetium 5586m Sestamibi  Stress Radionuclide Dose: 29.1 mCi           Stress Protocol Rest HR: 83 Stress HR:142  Rest BP: 144/86 Stress BP: 164/69  Exercise Time (min): 6:35 METS: 7.90   Predicted Max HR: 159 bpm % Max HR: 89.31 bpm Rate Pressure Product: [id number]  Dose of Adenosine (mg):  n/a Dose of Lexiscan: n/a mg  Dose of Atropine (mg): n/a Dose of Dobutamine: n/a mcg/kg/min (at max HR)  Stress Test Technologist: Ernestene MentionGwen Farrington, CCT Nuclear Technologist: Koren Shiverobin Moffitt, CNMT   Rest Procedure:  Myocardial perfusion imaging was performed at rest 45 minutes following the intravenous administration of Technetium 1386m Sestamibi. Stress Procedure:  The patient performed treadmill exercise using a Bruce  Protocol for 6 minutes and 35 seconds. The patient stopped due to shortness of breath and fatigue. Patient denied any chest pain.  There were no  significant ST-T wave changes.  Technetium 986m Sestamibi was injected at peak exercise and myocardial perfusion imaging was performed after a brief delay.  Transient Ischemic Dilatation (Normal <1.22):  1.03 Lung/Heart Ratio (Normal <0.45):  0.34  QGS EDV:  86 ml QGS ESV:  29 ml LV Ejection Fraction: 66%  .    Rest ECG: NSR , left axis deviation, nonspecific IVCD  Stress ECG: No significant ST segment change suggestive of ischemia.  QPS Raw Data Images:  Normal; no motion artifact; normal heart/lung ratio. Stress Images:  There is a small mid-septal perfusion defect Rest Images:  Comparison with the stress images reveals no significant change. Subtraction (SDS):  No evidence of ischemia. The septal perfusion abnormality is fixed and may represent an IVCD-related artifa LV Wall Motion:  NL LV Function; NL Wall Motion. EF 66%  Impression Exercise Capacity:  Fair exercise capacity. BP Response:  Normal blood pressure response. Clinical Symptoms:  No significant symptoms noted. ECG Impression:   EKG uninterpretable due to IVCDat rest and stress. Comparison with Prior Nuclear Study: No previous nuclear study performed   Overall Impression:  Low risk stress nuclear study with a fixed septal abnormality that is most likely an artifact related to IVCD.   Brandon Obrien,Brandon Auguste, MD  12/24/2013 12:24 PM

## 2013-12-26 ENCOUNTER — Ambulatory Visit (HOSPITAL_COMMUNITY)
Admission: RE | Admit: 2013-12-26 | Discharge: 2013-12-26 | Disposition: A | Discharge status: Home / Self Care | Payer: 59 | Source: Ambulatory Visit | Attending: Cardiovascular Disease | Admitting: Cardiovascular Disease

## 2013-12-26 DIAGNOSIS — R011 Cardiac murmur, unspecified: Secondary | ICD-10-CM

## 2013-12-26 DIAGNOSIS — I517 Cardiomegaly: Secondary | ICD-10-CM

## 2013-12-26 NOTE — Progress Notes (Signed)
2D Echo Performed 12/26/2013    Cheria Sadiq, RCS  

## 2013-12-31 ENCOUNTER — Other Ambulatory Visit (INDEPENDENT_AMBULATORY_CARE_PROVIDER_SITE_OTHER): Payer: Self-pay | Admitting: Surgery

## 2013-12-31 ENCOUNTER — Encounter: Payer: Self-pay | Admitting: Internal Medicine

## 2013-12-31 ENCOUNTER — Ambulatory Visit (INDEPENDENT_AMBULATORY_CARE_PROVIDER_SITE_OTHER): Payer: 59 | Admitting: Internal Medicine

## 2013-12-31 VITALS — BP 126/62 | HR 80 | Ht 64.0 in | Wt 222.6 lb

## 2013-12-31 DIAGNOSIS — E785 Hyperlipidemia, unspecified: Secondary | ICD-10-CM

## 2013-12-31 DIAGNOSIS — Z01810 Encounter for preprocedural cardiovascular examination: Secondary | ICD-10-CM

## 2013-12-31 DIAGNOSIS — I10 Essential (primary) hypertension: Secondary | ICD-10-CM

## 2013-12-31 DIAGNOSIS — R011 Cardiac murmur, unspecified: Secondary | ICD-10-CM

## 2013-12-31 DIAGNOSIS — R9431 Abnormal electrocardiogram [ECG] [EKG]: Secondary | ICD-10-CM

## 2013-12-31 NOTE — Patient Instructions (Signed)
Follow-up annually

## 2013-12-31 NOTE — Progress Notes (Signed)
OFFICE NOTE  Chief Complaint:  Pre-op assessment, murmur, abnormal EKG  Primary Care Physician: Johny BlamerHARRIS, WILLIAM, MD  HPI:  Brandon Obrien is a pleasant 63 year old male who works in the [employer] industry. 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 He's had difficulty with what sounds like sleep apnea, but is very adamant about not getting a test or perhaps wearing CPAP if he needs it.  He also has hypertension, dyslipidemia and an abnormal EKG. He's been struggling with a inguinal hernia and was scheduled for repair by Dr. Magnus IvanBlackman. His preoperative EKG was abnormal and he was referred for further workup. He is also known to have a heart murmur. His family history is significant for a father and 2 brothers who have hypertrophic obstructive cardiomyopathy.  He apparently underwent an echocardiogram for screening in the 1970s and was told he did not have it at that time. Subsequently, he has developed a heart murmur. He did not describe any chest pain with exertion, but is not physically active and is moderately obese.  He does get short of breath with marked exertion.  [name] returns today for followup of his echo and nuclear stress test. The nuclear stress test demonstrated no reversible perfusion defects, but there was a fixed septal defect likely related to interventricular conduction delay. He is echocardiogram, however was interesting in that it demonstrated moderate to severe septal hypertrophy, measuring 1.5 cm at the basal septum. There was no evidence of outflow tract obstruction, however I do believe that this represents some element of hypertrophic cardiomyopathy.  PMHx:  Past Medical History  Diagnosis Date  . Hyperlipidemia   . Hypertension   . Gout   . GERD (gastroesophageal reflux disease)   . Back pain   . Hearing loss   . Insomnia   . H/O hemorrhoids   . Strabismus   . Heart murmur   . Thyroid disease   .  Inguinal hernia     right  . Prediabetes     Past Surgical History  Procedure Laterality Date  . Tonsillectomy    . Fractured skull  1960    FAMHx:  Family History  Problem Relation Age of Onset  . Lung cancer Mother   . Prostate cancer Father     IHSS. COPD, emphysema  . Colon cancer Maternal Grandmother   . Stroke Maternal Grandfather   . Emphysema Maternal Grandfather   . Kidney failure Paternal Grandmother   . Stroke Paternal Grandfather   . Diabetes Brother     agent orange exposure  . Diabetes Sister   . Bipolar disorder Child   . Bipolar disorder Child     SOCHx:   reports that he quit smoking about 41 years ago. He has never used smokeless tobacco. He reports that he drinks about 15.0 ounces of alcohol per week. He reports that he does not use illicit drugs.  ALLERGIES:  Allergies  Allergen Reactions  . Valturna [Aliskiren-Valsartan] Other (See Comments)    Blurred vision, dizziness     ROS: A comprehensive review of systems was negative except for: Respiratory: positive for dyspnea on exertion Gastrointestinal: positive for hernia  HOME MEDS: Current Outpatient Prescriptions  Medication Sig Dispense Refill  . acetaminophen (TYLENOL) 325 MG tablet Take 650 mg by mouth every 6 (six) hours as needed for mild pain or moderate pain.       Marland Kitchen. allopurinol (ZYLOPRIM) 300 MG tablet Take 300  mg by mouth daily.      Marland Kitchen amLODipine (NORVASC) 5 MG tablet Take 5 mg by mouth every morning.       Marland Kitchen aspirin 81 MG tablet Take 81 mg by mouth every morning.       Marland Kitchen atorvastatin (LIPITOR) 40 MG tablet Take 40 mg by mouth every morning.       . irbesartan-hydrochlorothiazide (AVALIDE) 300-12.5 MG per tablet Take 1 tablet by mouth every morning.       Marland Kitchen levothyroxine (SYNTHROID, LEVOTHROID) 100 MCG tablet Take 100 mcg by mouth daily before breakfast.      . loratadine (CLARITIN) 10 MG tablet Take 10 mg by mouth daily.      . metoprolol succinate (TOPROL-XL) 100 MG 24 hr tablet  Take 50 mg by mouth every morning. Take with or immediately following a meal.      . Omega-3 Fatty Acids (FISH OIL) 1000 MG CAPS Take 1,000 mg by mouth daily.       . vitamin C (ASCORBIC ACID) 500 MG tablet Take 500 mg by mouth daily.      Marland Kitchen zolpidem (AMBIEN) 5 MG tablet Take 5 mg by mouth at bedtime as needed for sleep.       No current facility-administered medications for this visit.    LABS/IMAGING: No results found for this or any previous visit (from the past 48 hour(s)). No results found.  VITALS: BP 126/62  Pulse 80  Ht 5\' 4"  (1.626 m)  Wt 222 lb 9.6 oz (100.971 kg)  BMI 38.19 kg/m2  EXAM: deferred  EKG: Normal sinus rhythm at 72, nonspecific intraventricular conduction delay QRS duration 128 ms, voltage criteria for LVH  ASSESSMENT: 1. Low risk for an intermediate risk surgery 2. 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 Strong family history of first degree relatives with HOCM 4. Hypertension 5. Dyslipidemia 6. Moderate obesity with little exercise 7. Mild dyspnea with exertion  PLAN: 1.   Mr. Hopfensperger had a low risk nuclear stress test, suggesting that there is no ischemia. His echocardiogram showed normal systolic function however there is moderate to severe thickening of the basal septum which is likely consistent with a variant of hypertrophic cardiomyopathy (HCM).  There is no evidence for obstruction and is low risk for sudden cardiac death events.  I think he is low risk for surgery and did successfully undergo his procedure. He will need a followup echocardiogram in 2-3 years to evaluate for changes or thickening of the intraventricular septum.  Will plan to see him back annually or sooner as necessary. Thank you again for the kind referral.  Chrystie Nose, MD, [facility] Attending Cardiologist [facility]  HILTY,Kenneth C 12/31/2013, 8:48 AM

## 2014-01-09 ENCOUNTER — Encounter (HOSPITAL_COMMUNITY): Payer: Self-pay | Admitting: Pharmacy Technician

## 2014-01-09 NOTE — Pre-Procedure Instructions (Signed)
Brandon HuhDanny B Obrien  01/09/2014   Your procedure is scheduled on:  Thursday February 19 th at 1347 PM  [facility] Short [facility] Main Entrance "A"  at 1147 AM.  Call this number if you have problems the morning of surgery: [phone]   Remember:   Do not eat food or drink liquids after midnight Wednesday .   Take these medicines the morning of surgery with A SIP OF WATER: Amlodipine (NORVASC), Levothyroxine(SYNTHROID), Metoprolol(TOPROL-XL) and Claritin if needed for allergies, and Tylenol if needed for pain.   Stop Aspirin ,Ibuprofen(ADVIL,MOTRIN),Fish oil,and vitamins 5 days prior to surgery.  Do not wear jewelry.  Do not wear lotions, powders, or perfumes. You may wear deodorant.  Men may shave face and neck.  Do not bring valuables to the hospital.  [facility] is not responsible for any belongings or valuables.               Contacts, dentures or bridgework may not be worn into surgery.  Leave suitcase in the car. After surgery it may be brought to your room.  For patients admitted to the hospital, discharge time is determined by your  treatment team.               Patients discharged the day of surgery will not be allowed to drive home.    Special Instructions: [facility] - Preparing for Surgery  Before surgery, you can play an important role.  Because skin is not sterile, your skin needs to be as free of germs as possible.  You can reduce the number of germs on you skin by washing with CHG (chlorahexidine gluconate) soap before surgery.  CHG is an antiseptic cleaner which kills germs and bonds with the skin to continue killing germs even after washing.  Please DO NOT use if you have an allergy to CHG or antibacterial soaps.  If your skin becomes reddened/irritated stop using the CHG and inform your nurse when you arrive at Short Stay.  Do not shave (including legs and underarms) for at least 48 hours prior to the first CHG shower.  You may shave your face.  Please follow  these instructions carefully:   1.  Shower with CHG Soap the night before surgery and the  morning of Surgery.  2.  If you choose to wash your hair, wash your hair first as usual with your normal shampoo.  3.  After you shampoo, rinse your hair and body thoroughly to remove the Shampoo.  4.  Use CHG as you would any other liquid soap.  You can apply chg directly  to the skin and wash gently with scrungie or a clean washcloth.  5.  Apply the CHG Soap to your body ONLY FROM THE NECK DOWN. Do not use on open wounds or open sores.  Avoid contact with your eyes,       ears, mouth and genitals (private parts).  Wash genitals (private parts) with your normal soap.  6.  Wash thoroughly, paying special attention to the area where your surgery will be performed.  7.  Thoroughly rinse your body with warm water from the neck down.  8.  DO NOT shower/wash with your normal soap after using and rinsing off the CHG Soap.  9.  Pat yourself dry with a clean towel.            10.  Wear clean pajamas.            11.  Place  clean sheets on your bed the night of your first shower and do not sleep with pets.  Day of Surgery  Do not apply any lotions/deoderants the morning of surgery.  Please wear clean clothes to the hospital/surgery center.      Please read over the following fact sheets that you were given: Pain Booklet, Coughing and Deep Breathing and Surgical Site Infection Prevention

## 2014-01-10 ENCOUNTER — Encounter (HOSPITAL_COMMUNITY): Payer: Self-pay

## 2014-01-10 ENCOUNTER — Encounter (HOSPITAL_COMMUNITY): Payer: Self-pay | Admitting: Pharmacy Technician

## 2014-01-10 ENCOUNTER — Encounter (HOSPITAL_COMMUNITY)
Admission: RE | Admit: 2014-01-10 | Discharge: 2014-01-10 | Disposition: A | Discharge status: Home / Self Care | Payer: 59 | Source: Ambulatory Visit | Attending: Surgery | Admitting: Surgery

## 2014-01-10 DIAGNOSIS — Z01812 Encounter for preprocedural laboratory examination: Secondary | ICD-10-CM | POA: Insufficient documentation

## 2014-01-10 HISTORY — DX: Hypothyroidism, unspecified: E03.9

## 2014-01-10 LAB — COMPREHENSIVE METABOLIC PANEL
ALK PHOS: 97 U/L (ref 39–117)
ALT: 60 U/L — ABNORMAL HIGH (ref 0–53)
AST: 47 U/L — ABNORMAL HIGH (ref 0–37)
Albumin: 4.4 g/dL (ref 3.5–5.2)
BILIRUBIN TOTAL: 0.7 mg/dL (ref 0.3–1.2)
BUN: 15 mg/dL (ref 6–23)
CHLORIDE: 98 meq/L (ref 96–112)
CO2: 28 mEq/L (ref 19–32)
Calcium: 9.8 mg/dL (ref 8.4–10.5)
Creatinine, Ser: 0.81 mg/dL (ref 0.50–1.35)
GFR calc Af Amer: >90 mL/min (ref >90)
GFR calc non Af Amer: >90 mL/min (ref >90)
GLUCOSE: 100 mg/dL — AB (ref 70–99)
POTASSIUM: 4.7 meq/L (ref 3.7–5.3)
Sodium: 140 mEq/L (ref 137–147)
Total Protein: 8.1 g/dL (ref 6.0–8.3)

## 2014-01-10 LAB — CBC
HCT: 41.1 % (ref 39.0–52.0)
Hemoglobin: 14.2 g/dL (ref 13.0–17.0)
MCH: 32.6 pg (ref 26.0–34.0)
MCHC: 34.5 g/dL (ref 30.0–36.0)
MCV: 94.5 fL (ref 78.0–100.0)
PLATELETS: 178 10*3/uL (ref 150–400)
RBC: 4.35 MIL/uL (ref 4.22–5.81)
RDW: 13.1 % (ref 11.5–15.5)
WBC: 6.7 10*3/uL (ref 4.0–10.5)

## 2014-01-10 LAB — PROTIME-INR
INR: 1 (ref 0.00–1.49)
PROTHROMBIN TIME: 13 s (ref 11.6–15.2)

## 2014-01-10 LAB — APTT: aPTT: 30 seconds (ref 24–37)

## 2014-01-13 ENCOUNTER — Encounter (HOSPITAL_COMMUNITY): Payer: Self-pay

## 2014-01-13 NOTE — Progress Notes (Signed)
Anesthesia Chart Review:  Patient is a 63 year old male scheduled for right inguinal hernia repair on 01/16/14 by Dr. Barrie Dunker. Blackman.  Procedure was initially scheduled for 12/05/2013, but procedure was postponed due to need for cardiac clearance due to abnormal EKG with family history of hypertrophic obstructive cardiomyopathy. He was ultimately felt low risk from a CV standpoint by Dr. Rennis GoldenHilty following an echo and stress (see below).  History includes former smoker, GERD, HLD, HTN, hypothyroidism, pre-diabetes, heart murmur (trivial AR/PR/TR 11/2013 echo), strabismus, hearing loss, fractured skull '60's, tonsillectomy. BMI is 36, indicating obesity.  EKG on 12/17/13 showed NSR, LAD, LVH with QRS widening.  Echo on 12/16/13 showed: - Left ventricle: The cavity size was normal. There was mild focal basal hypertrophy of the septum. Systolic function was normal. The estimated ejection fraction was in the range of 55% to 60%. Wall motion was normal; there were no regional wall motion abnormalities. Doppler parameters are consistent with abnormal left ventricular relaxation (grade 1 diastolic dysfunction). - Valves: Trivial atrial, pulmonic, and tricuspid regurgitation. Impressions: No evidence of HOCM. Dr. Rennis GoldenHilty recommended follow-up echo in 2-3 years to evaluate for changes or thickening of the intraventricular septum.  Nuclear stress test on 12/24/13 showed: Overall Impression: Low risk stress nuclear study with a fixed septal abnormality that is most likely an artifact related to IVCD.   CXR on 12/04/13 showed: There is no evidence of pneumonia nor CHF or other acute cardiopulmonary abnormality.   Preoperative labs noted. AST/ALT mildly elevated. PLT count WNL.  Anticipate that he can proceed as planned.  Velna Ochsllison Hailee Hollick, PA-C [facility] Short Stay Center/Anesthesiology Phone [phone] 01/13/2014 12:04 PM

## 2014-01-15 MED ORDER — CEFAZOLIN SODIUM-DEXTROSE 2-3 GM-% IV SOLR
2.0000 g | INTRAVENOUS | Status: AC
  Administered 2014-01-16: 2 g via INTRAVENOUS
  Filled 2014-01-15: qty 50

## 2014-01-15 NOTE — H&P (Signed)
Chief Complaint   Patient presents with   .  New Evaluation     eval RIH   HPI  Brandon Obrien is a 63 y.o. male.  HPI  This is a pleasant gentleman who was referred by Dr. Johny BlamerWilliam Obrien for evaluation of a large right inguinal hernia. He has had a hernia for many years but reports now that it will not reduce. He has no pain for obstructive symptoms. He is otherwise without complaints.  Past Medical History   Diagnosis  Date   .  Hyperlipidemia    .  Hypertension    .  Gout    .  GERD (gastroesophageal reflux disease)    .  Back pain    .  Hearing loss    .  Insomnia    .  H/O hemorrhoids    .  Strabismus    .  Heart murmur    .  Thyroid disease    History reviewed. No pertinent past surgical history.  Family History   Problem  Relation  Age of Onset   .  Cancer  Mother      lung   .  Cancer  Father      protstate   .  Cancer  Maternal Grandmother      colon   Social History  History   Substance Use Topics   .  Smoking status:  Former Smoker     Quit date:  11/28/1972   .  Smokeless tobacco:  Never Used      Comment: quit in 1974   .  Alcohol Use:  1.2 oz/week     2 Cans of beer per week    Allergies   Allergen  Reactions   .  Valturna [Aliskiren-Valsartan]     Current Outpatient Prescriptions   Medication  Sig  Dispense  Refill   .  acetaminophen (TYLENOL) 325 MG tablet  Take 650 mg by mouth every 6 (six) hours as needed.     Marland Kitchen.  allopurinol (ZYLOPRIM) 300 MG tablet  Take 300 mg by mouth daily.     Marland Kitchen.  amLODipine (NORVASC) 5 MG tablet  Take 5 mg by mouth daily.     Marland Kitchen.  aspirin 81 MG tablet  Take 81 mg by mouth daily.     Marland Kitchen.  atorvastatin (LIPITOR) 40 MG tablet  Take 40 mg by mouth daily.     .  calcium carbonate (OS-CAL) 600 MG TABS tablet  Take 600 mg by mouth 2 (two) times daily with a meal.     .  irbesartan-hydrochlorothiazide (AVALIDE) 300-12.5 MG per tablet  Take 1 tablet by mouth daily.     Marland Kitchen.  levothyroxine (SYNTHROID, LEVOTHROID) 100 MCG tablet  Take 100 mcg  by mouth daily before breakfast.     .  loratadine (CLARITIN) 10 MG tablet  Take 10 mg by mouth daily.     .  metoprolol succinate (TOPROL-XL) 100 MG 24 hr tablet  Take 100 mg by mouth daily. Take with or immediately following a meal.     .  Omega-3 Fatty Acids (FISH OIL) 1000 MG CAPS  Take by mouth.     .  vitamin C (ASCORBIC ACID) 500 MG tablet  Take 500 mg by mouth daily.     Marland Kitchen.  zolpidem (AMBIEN) 5 MG tablet  Take 5 mg by mouth at bedtime as needed for sleep.      No current facility-administered medications for this visit.  Review of Systems  Review of Systems  Constitutional: Negative for fever, chills and unexpected weight change.  HENT: Positive for hearing loss. Negative for congestion, sore throat, trouble swallowing and voice change.  Eyes: Positive for visual disturbance.  Respiratory: Negative for cough and wheezing.  Cardiovascular: Negative for chest pain, palpitations and leg swelling.  Gastrointestinal: Negative for nausea, vomiting, abdominal pain, diarrhea, constipation, blood in stool, abdominal distention, anal bleeding and rectal pain.  Genitourinary: Negative for hematuria and difficulty urinating.  Musculoskeletal: Negative for arthralgias.  Skin: Negative for rash and wound.  Neurological: Negative for seizures, syncope, weakness and headaches.  Hematological: Negative for adenopathy. Does not bruise/bleed easily.  Psychiatric/Behavioral: Negative for confusion.  Blood pressure 180/96, pulse 80, temperature 98.4 F (36.9 C), temperature source Temporal, resp. rate 15, height 5' 5.25" (1.657 m), weight 226 lb 3.2 oz (102.604 kg).  Physical Exam  Physical Exam  Constitutional: He is oriented to person, place, and time. He appears well-developed and well-nourished. No distress.  HENT:  Head: Normocephalic and atraumatic.  Right Ear: External ear normal.  Left Ear: External ear normal.  Nose: Nose normal.  Mouth/Throat: Oropharynx is clear and moist.  Eyes:  Conjunctivae are normal. Pupils are equal, round, and reactive to light. Right eye exhibits no discharge. Left eye exhibits no discharge. No scleral icterus.  Neck: Normal range of motion. Neck supple. No tracheal deviation present.  Cardiovascular: Normal rate, regular rhythm, normal heart sounds and intact distal pulses.  No murmur heard.  Pulmonary/Chest: Effort normal and breath sounds normal. No respiratory distress. He has no wheezes.  Abdominal: Soft. Bowel sounds are normal. There is no tenderness. There is no rebound and no guarding.  Very large, chronically incarcerated right inguinal hernia which is nontender and going down into the scrotum  Musculoskeletal: Normal range of motion. He exhibits no edema and no tenderness.  Lymphadenopathy:  He has no cervical adenopathy.  Neurological: He is alert and oriented to person, place, and time.  Skin: Skin is warm and dry. No rash noted. He is not diaphoretic. No erythema.  Psychiatric: His behavior is normal. Judgment normal.  Data Reviewed  Assessment  Chronically incarcerated right inguinal hernia  Plan  Open right inguinal hernia repair with mesh was recommended. I discussed this with him in detail. I discussed the risk of surgery which includes but is not limited to bleeding, infection, injury to surrounding structures, chronic pain, nerve entrapment, recurrence, et Karie Sodacetera. He understands and wishes to proceed. Surgery will be scheduled

## 2014-01-15 NOTE — Progress Notes (Signed)
I spoke with patient and informed him of new arrival time of 0700.

## 2014-01-16 ENCOUNTER — Encounter (HOSPITAL_COMMUNITY): Payer: 59 | Admitting: Vascular Surgery

## 2014-01-16 ENCOUNTER — Ambulatory Visit (HOSPITAL_COMMUNITY): Payer: 59 | Admitting: Certified Registered"

## 2014-01-16 ENCOUNTER — Encounter (HOSPITAL_COMMUNITY)
Admission: RE | Disposition: A | Discharge status: Home / Self Care | Payer: Self-pay | Source: Ambulatory Visit | Attending: Surgery

## 2014-01-16 ENCOUNTER — Encounter (HOSPITAL_COMMUNITY): Payer: Self-pay | Admitting: *Deleted

## 2014-01-16 ENCOUNTER — Ambulatory Visit (HOSPITAL_COMMUNITY)
Admission: RE | Admit: 2014-01-16 | Discharge: 2014-01-16 | Disposition: A | Discharge status: Home / Self Care | Payer: 59 | Source: Ambulatory Visit | Attending: Surgery | Admitting: Surgery

## 2014-01-16 DIAGNOSIS — R011 Cardiac murmur, unspecified: Secondary | ICD-10-CM | POA: Insufficient documentation

## 2014-01-16 DIAGNOSIS — Z87891 Personal history of nicotine dependence: Secondary | ICD-10-CM | POA: Insufficient documentation

## 2014-01-16 DIAGNOSIS — K219 Gastro-esophageal reflux disease without esophagitis: Secondary | ICD-10-CM | POA: Insufficient documentation

## 2014-01-16 DIAGNOSIS — I10 Essential (primary) hypertension: Secondary | ICD-10-CM | POA: Insufficient documentation

## 2014-01-16 DIAGNOSIS — H9190 Unspecified hearing loss, unspecified ear: Secondary | ICD-10-CM | POA: Insufficient documentation

## 2014-01-16 DIAGNOSIS — Z7982 Long term (current) use of aspirin: Secondary | ICD-10-CM | POA: Insufficient documentation

## 2014-01-16 DIAGNOSIS — M109 Gout, unspecified: Secondary | ICD-10-CM | POA: Insufficient documentation

## 2014-01-16 DIAGNOSIS — E785 Hyperlipidemia, unspecified: Secondary | ICD-10-CM | POA: Insufficient documentation

## 2014-01-16 DIAGNOSIS — K4031 Unilateral inguinal hernia, with obstruction, without gangrene, recurrent: Secondary | ICD-10-CM | POA: Insufficient documentation

## 2014-01-16 DIAGNOSIS — K4090 Unilateral inguinal hernia, without obstruction or gangrene, not specified as recurrent: Secondary | ICD-10-CM

## 2014-01-16 DIAGNOSIS — E079 Disorder of thyroid, unspecified: Secondary | ICD-10-CM | POA: Insufficient documentation

## 2014-01-16 HISTORY — PX: INGUINAL HERNIA REPAIR: SHX194

## 2014-01-16 HISTORY — PX: INSERTION OF MESH: SHX5868

## 2014-01-16 SURGERY — REPAIR, HERNIA, INGUINAL, ADULT
Anesthesia: General | Site: Groin | Laterality: Right

## 2014-01-16 MED ORDER — KETOROLAC TROMETHAMINE 30 MG/ML IJ SOLN
INTRAMUSCULAR | Status: AC
  Filled 2014-01-16: qty 1

## 2014-01-16 MED ORDER — HYDROMORPHONE HCL PF 1 MG/ML IJ SOLN
INTRAMUSCULAR | Status: AC
  Filled 2014-01-16: qty 1

## 2014-01-16 MED ORDER — PROPOFOL 10 MG/ML IV BOLUS
INTRAVENOUS | Status: DC | PRN
  Administered 2014-01-16: 200 mg via INTRAVENOUS

## 2014-01-16 MED ORDER — LIDOCAINE HCL (CARDIAC) 20 MG/ML IV SOLN
INTRAVENOUS | Status: DC | PRN
  Administered 2014-01-16: 60 mg via INTRAVENOUS

## 2014-01-16 MED ORDER — LACTATED RINGERS IV SOLN
INTRAVENOUS | Status: DC
  Administered 2014-01-16: 07:00:00 via INTRAVENOUS

## 2014-01-16 MED ORDER — SODIUM CHLORIDE 0.9 % IJ SOLN: 3.0000 mL | INTRAMUSCULAR | Status: DC | PRN

## 2014-01-16 MED ORDER — HYDROMORPHONE HCL PF 1 MG/ML IJ SOLN
0.2500 mg | INTRAMUSCULAR | Status: DC | PRN
  Administered 2014-01-16: 0.5 mg via INTRAVENOUS

## 2014-01-16 MED ORDER — BUPIVACAINE-EPINEPHRINE PF 0.5-1:200000 % IJ SOLN
INTRAMUSCULAR | Status: DC | PRN
Start: 2014-01-16 — End: 2014-01-16
  Administered 2014-01-16: 30 mL

## 2014-01-16 MED ORDER — OXYCODONE HCL 5 MG PO TABS
5.0000 mg | ORAL_TABLET | ORAL | Status: DC | PRN
  Administered 2014-01-16: 5 mg via ORAL

## 2014-01-16 MED ORDER — ONDANSETRON HCL 4 MG/2ML IJ SOLN
INTRAMUSCULAR | Status: AC
  Filled 2014-01-16: qty 2

## 2014-01-16 MED ORDER — PROPOFOL 10 MG/ML IV BOLUS
INTRAVENOUS | Status: AC
  Filled 2014-01-16: qty 20

## 2014-01-16 MED ORDER — ACETAMINOPHEN 650 MG RE SUPP: 650.0000 mg | RECTAL | Status: DC | PRN

## 2014-01-16 MED ORDER — ONDANSETRON HCL 4 MG/2ML IJ SOLN
INTRAMUSCULAR | Status: DC | PRN
  Administered 2014-01-16: 4 mg via INTRAVENOUS

## 2014-01-16 MED ORDER — OXYCODONE HCL 5 MG PO TABS
ORAL_TABLET | ORAL | Status: AC
  Filled 2014-01-16: qty 1

## 2014-01-16 MED ORDER — FENTANYL CITRATE 0.05 MG/ML IJ SOLN
INTRAMUSCULAR | Status: AC
  Filled 2014-01-16: qty 5

## 2014-01-16 MED ORDER — SODIUM CHLORIDE 0.9 % IV SOLN: 250.0000 mL | INTRAVENOUS | Status: DC | PRN

## 2014-01-16 MED ORDER — ONDANSETRON HCL 4 MG/2ML IJ SOLN: 4.0000 mg | Freq: Four times a day (QID) | INTRAMUSCULAR | Status: DC | PRN

## 2014-01-16 MED ORDER — MORPHINE SULFATE 4 MG/ML IJ SOLN: 4.0000 mg | INTRAMUSCULAR | Status: DC | PRN

## 2014-01-16 MED ORDER — MIDAZOLAM HCL 2 MG/2ML IJ SOLN
INTRAMUSCULAR | Status: AC
  Filled 2014-01-16: qty 2

## 2014-01-16 MED ORDER — MIDAZOLAM HCL 5 MG/5ML IJ SOLN
INTRAMUSCULAR | Status: DC | PRN
  Administered 2014-01-16: 2 mg via INTRAVENOUS

## 2014-01-16 MED ORDER — BUPIVACAINE-EPINEPHRINE (PF) 0.5% -1:200000 IJ SOLN
INTRAMUSCULAR | Status: AC
  Filled 2014-01-16: qty 10

## 2014-01-16 MED ORDER — FENTANYL CITRATE 0.05 MG/ML IJ SOLN
INTRAMUSCULAR | Status: DC | PRN
  Administered 2014-01-16 (×2): 50 ug via INTRAVENOUS

## 2014-01-16 MED ORDER — KETOROLAC TROMETHAMINE 30 MG/ML IJ SOLN
INTRAMUSCULAR | Status: DC | PRN
  Administered 2014-01-16: 30 mg via INTRAVENOUS

## 2014-01-16 MED ORDER — ACETAMINOPHEN 325 MG PO TABS: 650.0000 mg | ORAL_TABLET | ORAL | Status: DC | PRN

## 2014-01-16 MED ORDER — SODIUM CHLORIDE 0.9 % IJ SOLN: 3.0000 mL | Freq: Two times a day (BID) | INTRAMUSCULAR | Status: DC

## 2014-01-16 MED ORDER — LIDOCAINE HCL (CARDIAC) 20 MG/ML IV SOLN
INTRAVENOUS | Status: AC
  Filled 2014-01-16: qty 5

## 2014-01-16 MED ORDER — HYDROCODONE-ACETAMINOPHEN 5-325 MG PO TABS: 1.0000 | ORAL_TABLET | Freq: Four times a day (QID) | ORAL | Status: DC | PRN

## 2014-01-16 MED ORDER — 0.9 % SODIUM CHLORIDE (POUR BTL) OPTIME
TOPICAL | Status: DC | PRN
  Administered 2014-01-16: 1000 mL

## 2014-01-16 SURGICAL SUPPLY — 43 items
APL SKNCLS STERI-STRIP NONHPOA (GAUZE/BANDAGES/DRESSINGS) ×1
BENZOIN TINCTURE PRP APPL 2/3 (GAUZE/BANDAGES/DRESSINGS) ×2 IMPLANT
BLADE SURG 10 STRL SS (BLADE) ×2 IMPLANT
BLADE SURG 15 STRL LF DISP TIS (BLADE) ×1 IMPLANT
BLADE SURG 15 STRL SS (BLADE) ×2
BLADE SURG ROTATE 9660 (MISCELLANEOUS) IMPLANT
CHLORAPREP W/TINT 26ML (MISCELLANEOUS) ×2 IMPLANT
COVER SURGICAL LIGHT HANDLE (MISCELLANEOUS) ×2 IMPLANT
DRAIN PENROSE 1/2X12 LTX STRL (WOUND CARE) ×1 IMPLANT
DRAPE LAPAROTOMY TRNSV 102X78 (DRAPE) ×2 IMPLANT
DRAPE UTILITY 15X26 W/TAPE STR (DRAPE) ×4 IMPLANT
DRESSING TELFA 8X3 (GAUZE/BANDAGES/DRESSINGS) ×2 IMPLANT
DRSG TEGADERM 4X4.75 (GAUZE/BANDAGES/DRESSINGS) ×2 IMPLANT
ELECT CAUTERY BLADE 6.4 (BLADE) ×2 IMPLANT
ELECT REM PT RETURN 9FT ADLT (ELECTROSURGICAL) ×2
ELECTRODE REM PT RTRN 9FT ADLT (ELECTROSURGICAL) ×1 IMPLANT
GLOVE BIOGEL PI IND STRL 7.0 (GLOVE) IMPLANT
GLOVE BIOGEL PI INDICATOR 7.0 (GLOVE) ×1
GLOVE SURG SIGNA 7.5 PF LTX (GLOVE) ×2 IMPLANT
GLOVE SURG SS PI 6.5 STRL IVOR (GLOVE) ×1 IMPLANT
GOWN STRL NON-REIN LRG LVL3 (GOWN DISPOSABLE) ×2 IMPLANT
GOWN STRL REIN XL XLG (GOWN DISPOSABLE) ×2 IMPLANT
KIT BASIN OR (CUSTOM PROCEDURE TRAY) ×2 IMPLANT
KIT ROOM TURNOVER OR (KITS) ×2 IMPLANT
MESH PARIETEX PROGRIP RIGHT (Mesh General) ×1 IMPLANT
NDL HYPO 25GX1X1/2 BEV (NEEDLE) ×1 IMPLANT
NEEDLE HYPO 25GX1X1/2 BEV (NEEDLE) ×2 IMPLANT
NS IRRIG 1000ML POUR BTL (IV SOLUTION) ×2 IMPLANT
PACK SURGICAL SETUP 50X90 (CUSTOM PROCEDURE TRAY) ×2 IMPLANT
PAD ARMBOARD 7.5X6 YLW CONV (MISCELLANEOUS) ×4 IMPLANT
PENCIL BUTTON HOLSTER BLD 10FT (ELECTRODE) ×2 IMPLANT
SPECIMEN JAR SMALL (MISCELLANEOUS) IMPLANT
SPONGE LAP 18X18 X RAY DECT (DISPOSABLE) ×2 IMPLANT
STRIP CLOSURE SKIN 1/2X4 (GAUZE/BANDAGES/DRESSINGS) ×2 IMPLANT
SUT MON AB 4-0 PC3 18 (SUTURE) ×2 IMPLANT
SUT SILK 2 0 SH (SUTURE) IMPLANT
SUT VIC AB 2-0 CT1 27 (SUTURE) ×6
SUT VIC AB 2-0 CT1 TAPERPNT 27 (SUTURE) ×2 IMPLANT
SUT VIC AB 3-0 CT1 27 (SUTURE) ×2
SUT VIC AB 3-0 CT1 TAPERPNT 27 (SUTURE) ×1 IMPLANT
SYR CONTROL 10ML LL (SYRINGE) ×2 IMPLANT
TOWEL OR 17X24 6PK STRL BLUE (TOWEL DISPOSABLE) ×2 IMPLANT
TOWEL OR 17X26 10 PK STRL BLUE (TOWEL DISPOSABLE) ×2 IMPLANT

## 2014-01-16 NOTE — Interval H&P Note (Signed)
History and Physical Interval Note: no change in H and P  01/16/2014 6:51 AM  Brandon Obrien  has presented today for surgery, with the diagnosis of right inguinal hernia   The various methods of treatment have been discussed with the patient and family. After consideration of risks, benefits and other options for treatment, the patient has consented to  Procedure(s): HERNIA REPAIR INGUINAL ADULT (Right) INSERTION OF MESH (Right) as a surgical intervention .  The patient's history has been reviewed, patient examined, no change in status, stable for surgery.  I have reviewed the patient's chart and labs.  Questions were answered to the patient's satisfaction.     [name] A

## 2014-01-16 NOTE — Discharge Instructions (Signed)
[facility] _______[facility]  UMBILICAL OR INGUINAL HERNIA REPAIR: POST OP INSTRUCTIONS  Always review your discharge instruction sheet given to you by the facility where your surgery was performed. IF YOU HAVE DISABILITY OR FAMILY LEAVE FORMS, YOU MUST BRING THEM TO THE OFFICE FOR PROCESSING.   DO NOT GIVE THEM TO YOUR DOCTOR.  1. A  prescription for pain medication may be given to you upon discharge.  Take your pain medication as prescribed, if needed.  If narcotic pain medicine is not needed, then you may take acetaminophen (Tylenol) or ibuprofen (Advil) as needed. 2. Take your usually prescribed medications unless otherwise directed. 3. If you need a refill on your pain medication, please contact your pharmacy.  They will contact our office to request authorization. Prescriptions will not be filled after 5 pm or on week-ends. 4. You should follow a light diet the first 24 hours after arrival home, such as soup and crackers, etc.  Be sure to include lots of fluids daily.  Resume your normal diet the day after surgery. 5. Most patients will experience some swelling and bruising around the umbilicus or in the groin and scrotum.  Ice packs and reclining will help.  Swelling and bruising can take several days to resolve.  6. It is common to experience some constipation if taking pain medication after surgery.  Increasing fluid intake and taking a stool softener (such as Colace) will usually help or prevent this problem from occurring.  A mild laxative (Milk of Magnesia or Miralax) should be taken according to package directions if there are no bowel movements after 48 hours. 7. Unless discharge instructions indicate otherwise, you may remove your bandages 24-48 hours after surgery, and you may shower at that time.  You may have steri-strips (small skin tapes) in place directly over the incision.  These strips should be left on the skin for 7-10 days.  If your surgeon used skin glue on the  incision, you may shower in 24 hours.  The glue will flake off over the next 2-3 weeks.  Any sutures or staples will be removed at the office during your follow-up visit. 8. ACTIVITIES:  You may resume regular (light) daily activities beginning the next day--such as daily self-care, walking, climbing stairs--gradually increasing activities as tolerated.  You may have sexual intercourse when it is comfortable.  Refrain from any heavy lifting or straining until approved by your doctor. a. You may drive when you are no longer taking prescription pain medication, you can comfortably wear a seatbelt, and you can safely maneuver your car and apply brakes. b. RETURN TO WORK:  __________________________________________________________ 9. You should see your doctor in the office for a follow-up appointment approximately 2-3 weeks after your surgery.  Make sure that you call for this appointment within a day or two after you arrive home to insure a convenient appointment time. OTHER INSTRUCTIONS:  _______________________________NO WORK, LIFTING, BENDING FOR 6 WEEKS. ICE PACK AND IBUPROFEN AS NEEDED FOR PAIN STOOL SOFTENER FOR CONSTIPATION__________________________________________________________________________________________________________________________________________________________  WHEN TO CALL YOUR DOCTOR: 1. Fever over 101.0 2. Inability to urinate 3. Nausea and/or vomiting 4. Extreme swelling or bruising 5. Continued bleeding from incision. 6. Increased pain, redness, or drainage from the incision  The clinic staff is available to answer your questions during regular business hours.  Please dont hesitate to call and ask to speak to one of the nurses for clinical concerns.  If you have a medical emergency, go to the nearest emergency room or call  911.  [facility] is always on call at the hospital   [address], [address], [place], [place]  [address] ?  [address] [address], [place], [place]   [id number] [phone] ? [phone] ? FAX [phone] Web site: [email or web address]  What to eat:  For your first meals, you should eat lightly; only small meals initially.  If you do not have nausea, you may eat larger meals.  Avoid spicy, greasy and heavy food.    General Anesthesia, Adult, Care After  Refer to this sheet in the next few weeks. These instructions provide you with information on caring for yourself after your procedure. Your health care provider may also give you more specific instructions. Your treatment has been planned according to current medical practices, but problems sometimes occur. Call your health care provider if you have any problems or questions after your procedure.  WHAT TO EXPECT AFTER THE PROCEDURE  After the procedure, it is typical to experience:  Sleepiness.  Nausea and vomiting. HOME CARE INSTRUCTIONS  For the first 24 hours after general anesthesia:  Have a responsible person with you.  Do not drive a car. If you are alone, do not take public transportation.  Do not drink alcohol.  Do not take medicine that has not been prescribed by your health care provider.  Do not sign important papers or make important decisions.  You may resume a normal diet and activities as directed by your health care provider.  Change bandages (dressings) as directed.  If you have questions or problems that seem related to general anesthesia, call the hospital and ask for the anesthetist or anesthesiologist on call. SEEK MEDICAL CARE IF:  You have nausea and vomiting that continue the day after anesthesia.  You develop a rash. SEEK IMMEDIATE MEDICAL CARE IF:  You have difficulty breathing.  You have chest pain.  You have any allergic problems. Document Released: 02/20/2001 Document Revised: 07/17/2013 Document Reviewed: 05/30/2013  [facility] Patient Information 2014 [employer] [place].

## 2014-01-16 NOTE — Anesthesia Postprocedure Evaluation (Signed)
  Anesthesia Post-op Note  Patient: Brandon RyderDanny B Violet  Procedure(s) Performed: Procedure(s): HERNIA REPAIR INGUINAL ADULT (Right) INSERTION OF MESH (Right)  Patient Location: PACU  Anesthesia Type:General  Level of Consciousness: awake  Airway and Oxygen Therapy: Patient Spontanous Breathing  Post-op Pain: mild  Post-op Assessment: Post-op Vital signs reviewed  Post-op Vital Signs: Reviewed  Complications: No apparent anesthesia complications

## 2014-01-16 NOTE — Anesthesia Procedure Notes (Signed)
Procedure Name: LMA Insertion Date/Time: 01/16/2014 9:23 AM Performed by: Arlice ColtMANESS, Shaleta Ruacho B Pre-anesthesia Checklist: Patient identified, Emergency Drugs available, Suction available, Patient being monitored and Timeout performed Patient Re-evaluated:Patient Re-evaluated prior to inductionOxygen Delivery Method: Circle system utilized Preoxygenation: Pre-oxygenation with 100% oxygen Intubation Type: IV induction LMA: LMA inserted LMA Size: 4.0 Number of attempts: 1 Placement Confirmation: positive ETCO2 and breath sounds checked- equal and bilateral Tube secured with: Tape Dental Injury: Teeth and Oropharynx as per pre-operative assessment

## 2014-01-16 NOTE — Op Note (Signed)
HERNIA REPAIR INGUINAL ADULT, INSERTION OF MESH  Procedure Note  Brandon Obrien 01/16/2014   Pre-op Diagnosis: Right inguinal hernia      Post-op Diagnosis: same  Procedure(s): HERNIA REPAIR INGUINAL ADULT INSERTION OF MESH  Surgeon(s): Shelly Rubensteinouglas A Milda Lindvall, MD  Anesthesia: General  Staff:  Circulator: Lear NgStephanie Nicole Dehart, RN Relief Circulator: Rolan LipaKathleen D Hunt, RN; Caprice KluverNicholas Ryan Cranston, RN Scrub Person: Astrid Draftsina L Clayton, RN  Estimated Blood Loss: Minimal                         [name] A   Date: 01/16/2014  Time: 10:12 AM

## 2014-01-16 NOTE — Transfer of Care (Signed)
Immediate Anesthesia Transfer of Care Note  Patient: Brandon Obrien  Procedure(s) Performed: Procedure(s): HERNIA REPAIR INGUINAL ADULT (Right) INSERTION OF MESH (Right)  Patient Location: PACU  Anesthesia Type:General  Level of Consciousness: awake, alert  and oriented  Airway & Oxygen Therapy: Patient Spontanous Breathing  Post-op Assessment: Report given to PACU RN and Post -op Vital signs reviewed and stable  Post vital signs: Reviewed and stable  Complications: No apparent anesthesia complications

## 2014-01-16 NOTE — Anesthesia Preprocedure Evaluation (Addendum)
Anesthesia Evaluation  Patient identified by MRN, date of birth, ID band Patient awake    Reviewed: Allergy & Precautions, H&P , NPO status , Patient's Chart, lab work & pertinent test results, reviewed documented beta blocker date and time   History of Anesthesia Complications Negative for: history of anesthetic complications  Airway Mallampati: II TM Distance: >3 FB Neck ROM: Full    Dental  (+) Teeth Intact   Pulmonary former smoker,          Cardiovascular hypertension, Pt. on medications and Pt. on home beta blockers     Neuro/Psych    GI/Hepatic GERD-  ,  Endo/Other  Hypothyroidism   Renal/GU      Musculoskeletal   Abdominal   Peds  Hematology   Anesthesia Other Findings   Reproductive/Obstetrics                          Anesthesia Physical Anesthesia Plan  ASA: II  Anesthesia Plan: General   Post-op Pain Management:    Induction: Intravenous  Airway Management Planned: LMA  Additional Equipment:   Intra-op Plan:   Post-operative Plan:   Informed Consent: I have reviewed the patients History and Physical, chart, labs and discussed the procedure including the risks, benefits and alternatives for the proposed anesthesia with the patient or authorized representative who has indicated his/her understanding and acceptance.   Dental advisory given  Plan Discussed with: Anesthesiologist and Surgeon  Anesthesia Plan Comments:         Anesthesia Quick Evaluation

## 2014-01-17 ENCOUNTER — Encounter (HOSPITAL_COMMUNITY): Payer: Self-pay | Admitting: Surgery

## 2014-01-17 NOTE — Op Note (Signed)
NAMLesia Obrien:  [name], [name]                   ACCOUNT NO.:  [id number]  MEDICAL RECORD NO.:  [id number]  LOCATION:  [facility]                         FACILITY:  [facility]  PHYSICIAN:  Abigail Miyamotoouglas Lizzet Hendley, M.D. DATE OF BIRTH:  January 18, 1951  DATE OF PROCEDURE:  01/16/2014 DATE OF DISCHARGE:  01/16/2014                              OPERATIVE REPORT   PREOPERATIVE DIAGNOSIS:  Right inguinal hernia.  POSTOPERATIVE DIAGNOSIS:  Right inguinal hernia.  PROCEDURE:  Right inguinal hernia repair with mesh.  SURGEON:  Abigail Miyamotoouglas Esmee Fallaw, M.D.  ANESTHESIA:  General anesthesia, and 0.5% Marcaine.  ESTIMATED BLOOD LOSS:  Minimal.  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  PROCEDURE IN DETAIL:  The patient was brought into the operating room, identified as [name].  He was placed supine on the operating room table.  General anesthesia was induced.  His right lower quadrant was prepped and draped in usual sterile fashion.  I performed an ilioinguinal nerve block with Marcaine.  I anesthetized the skin with Marcaine as well.  I then made a longitudinal incision with a scalpel. I took this down through Scarpa fascia with electrocautery.  External oblique fascia was then identified and opened with internal and external rings.  The patient had an extremely large direct hernia defect, which actually was going down into the scrotum as well.  I was able to pull the entire sac up and then opened up the sac.  I reduced approximately 1.5 feet of small bowel back into the abdominal cavity.  I then tied off the base of the sac with a pursestring 2-0 silk suture.  I then had to reapproximate the floor of the inguinal canal with interrupted 2-0 silk sutures.  I controlled the testicular cord and the structure with a Penrose drain.  I then placed a Parietex ProGrip mesh on to the floor of the inguinal canal.   I brought around the cord structures.  I then sutured it in place.  I then sutured the mesh to the pubic tubercle, the shelving edge of the inguinal ligament, and transversalis fascia with interrupted 2-0 Vicryl sutures.  I was then able to close the external oblique fascia over top of the mesh with 2-0 Vicryl sutures as well.  I then anesthetized the fascia further with Marcaine.  I then closed the Scarpa fascia with interrupted 3-0 Vicryl sutures and closed the skin with running 4-0 Monocryl.  Steri-Strips, gauze, and Tegaderm were then applied.  The patient tolerated the procedure well.  All the counts were correct at the end of procedure.  The patient was then extubated in the operating room and taken in stable condition to recovery room.     Abigail Miyamotoouglas Audry Kauzlarich, M.D.     DB/MEDQ  D:  01/16/2014  T:  01/17/2014  Job:  [id number]

## 2014-02-17 ENCOUNTER — Ambulatory Visit (INDEPENDENT_AMBULATORY_CARE_PROVIDER_SITE_OTHER): Payer: No Typology Code available for payment source | Admitting: Surgery

## 2014-02-17 ENCOUNTER — Encounter (INDEPENDENT_AMBULATORY_CARE_PROVIDER_SITE_OTHER): Payer: Self-pay | Admitting: Surgery

## 2014-02-17 VITALS — BP 127/76 | HR 70 | Temp 98.1°F | Resp 16 | Ht 65.5 in | Wt 214.0 lb

## 2014-02-17 DIAGNOSIS — Z09 Encounter for follow-up examination after completed treatment for conditions other than malignant neoplasm: Secondary | ICD-10-CM

## 2014-02-17 NOTE — Progress Notes (Signed)
Subjective:     Patient ID: Brandon Obrien, male   DOB: 01/04/1951, 63 y.o.   MRN: [id number]  HPI He is here for his postop visit status post right inguinal hernia repair with mesh. This was a very large hernia. He currently has minimal complaints  Review of Systems     Objective:   Physical Exam On exam, the incision is healing well. There is no evidence of infection or recurrence    Assessment:     Patient stable postop     Plan:     Because of the sheer size of this hernia and the amount of bowel that had to be reduced, I do not want him returning to the lifting whatsoever until 02/26/2014. Once he returns to normal activity, should he have any discomfort, he will come back and see me

## 2014-12-11 ENCOUNTER — Encounter (HOSPITAL_COMMUNITY): Payer: Self-pay | Admitting: Surgery

## 2015-11-03 IMAGING — CR DG CHEST 2V
2 series · 2 of 2 positions shown · non-contrast
Comparison: None.

CLINICAL DATA: Preoperative study prior to inguinal hernia repair.
The patient has a history of hypertension. Smoking in 1712.

EXAM:
CHEST  2 VIEW

[w chest pa]
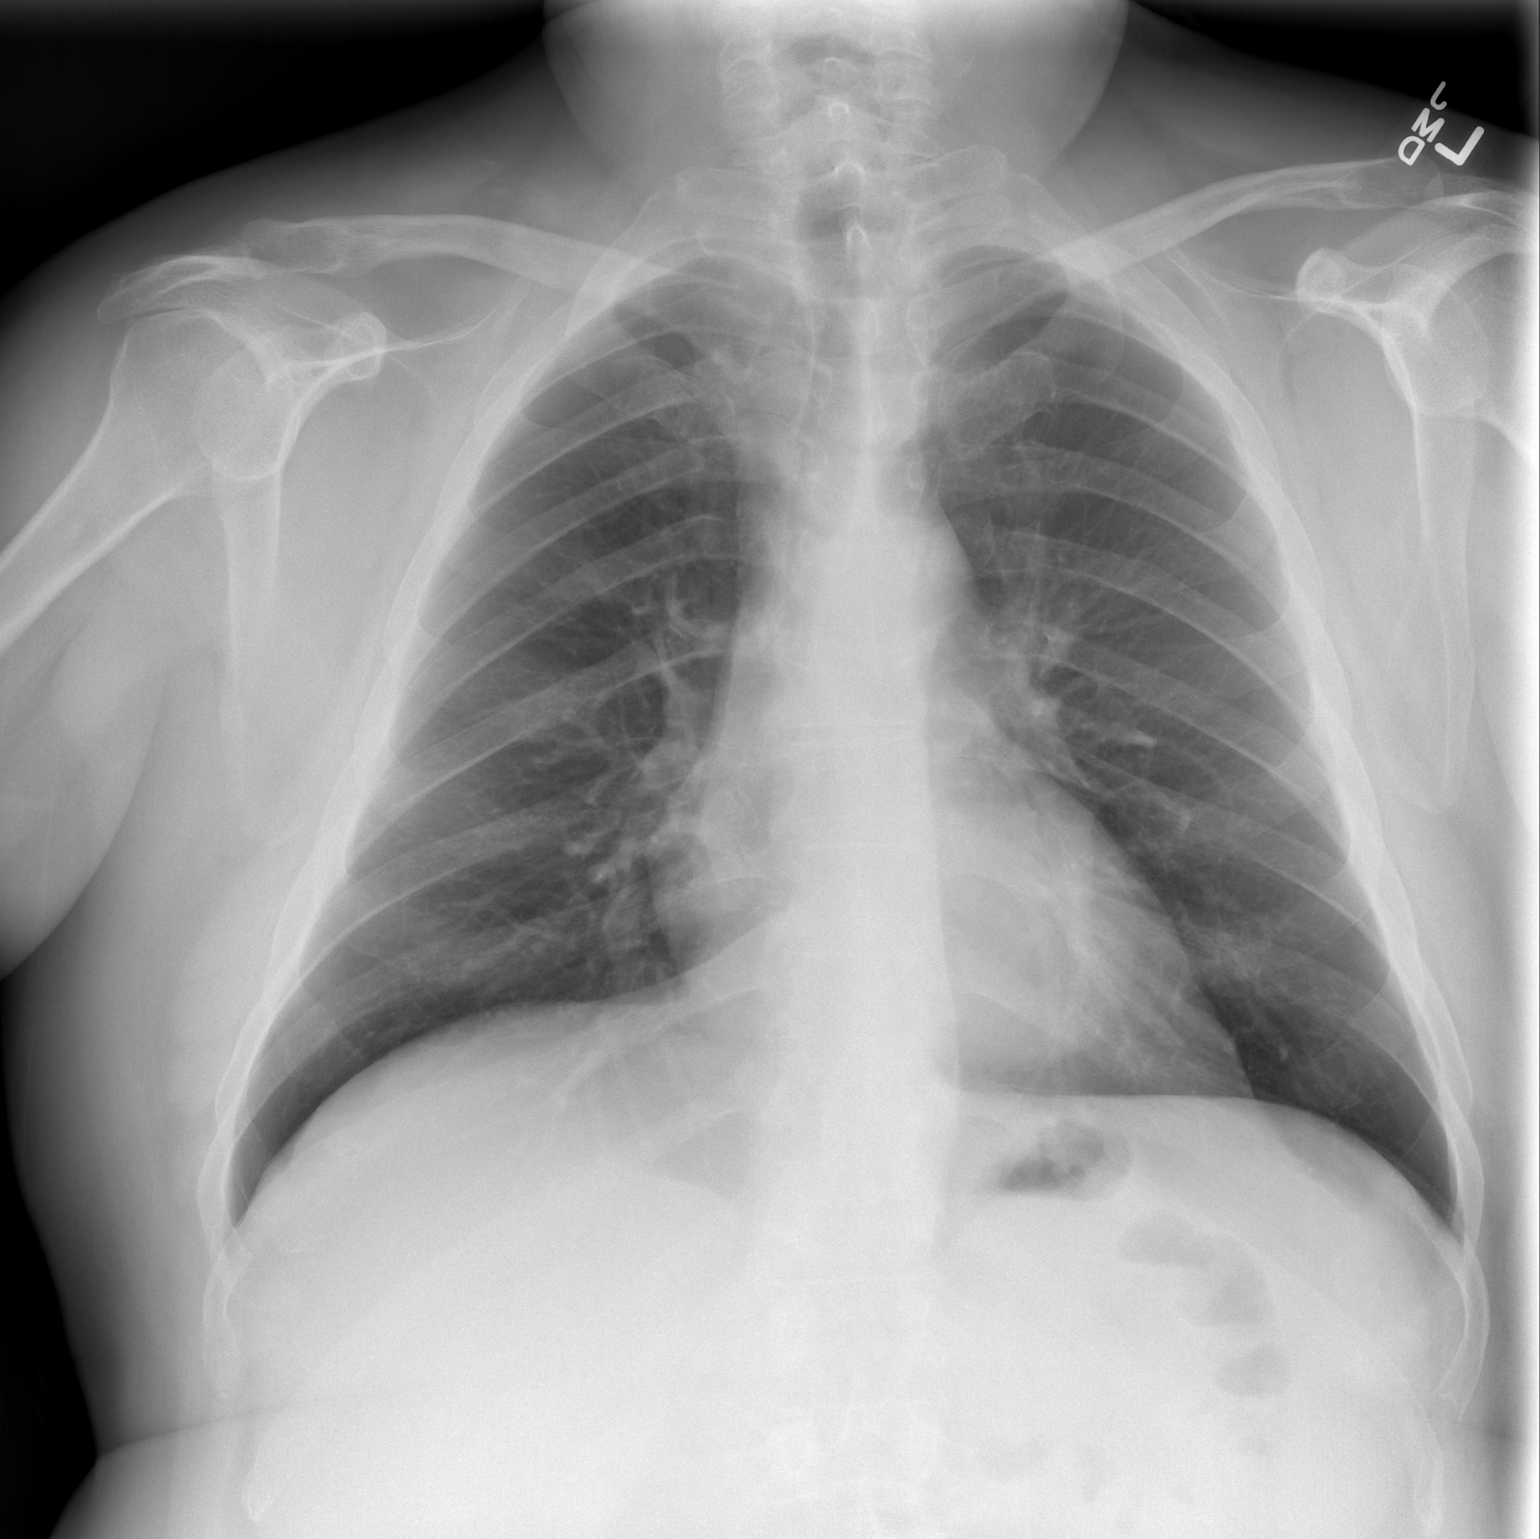

[w chest lat]
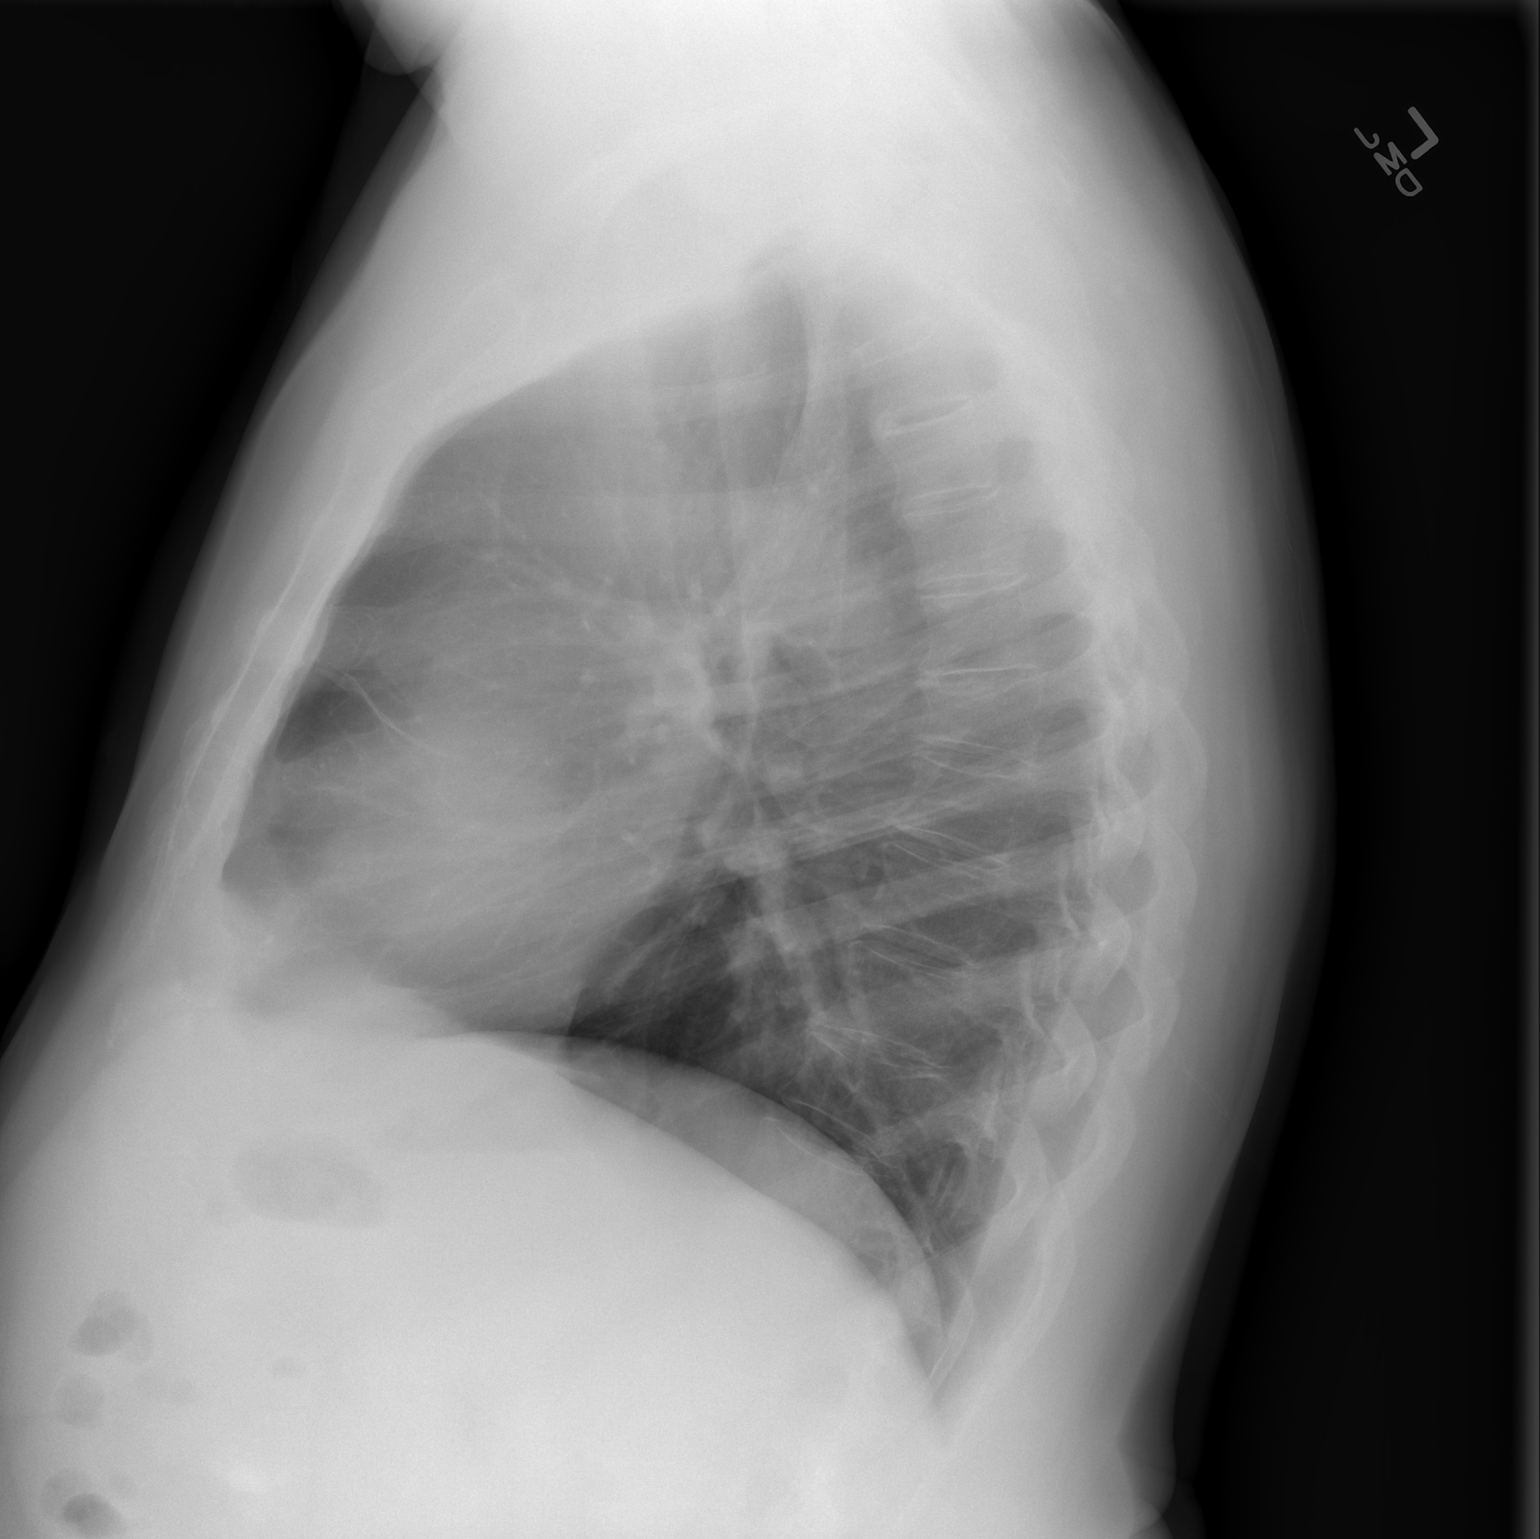

[2 of 2 positions shown; findings below may reference images not displayed]

FINDINGS: The lungs are adequately inflated. There is no focal infiltrate. The
cardiac silhouette is normal in size. The mediastinum is normal in
width. There is no pleural effusion or pneumothorax the observed
portions of the bony thorax exhibit no acute abnormality. There is
mild anterior wedging of a mid thoracic vertebral body.
IMPRESSION: There is no evidence of pneumonia nor CHF or other acute
cardiopulmonary abnormality.
